# Patient Record
Sex: Male | Born: 1960 | Race: White | Hispanic: No | Marital: Married | Smoking: Never smoker
Health system: Southern US, Community
[De-identification: ages and names within clinical notes are randomized; demographics above are authoritative.]

## PROBLEM LIST (undated history)

## (undated) DIAGNOSIS — I10 Essential (primary) hypertension: Secondary | ICD-10-CM

## (undated) DIAGNOSIS — E785 Hyperlipidemia, unspecified: Secondary | ICD-10-CM

## (undated) DIAGNOSIS — Z8709 Personal history of other diseases of the respiratory system: Secondary | ICD-10-CM

## (undated) DIAGNOSIS — J302 Other seasonal allergic rhinitis: Secondary | ICD-10-CM

## (undated) HISTORY — DX: Other seasonal allergic rhinitis: J30.2

## (undated) HISTORY — DX: Personal history of other diseases of the respiratory system: Z87.09

## (undated) HISTORY — DX: Essential (primary) hypertension: I10

## (undated) HISTORY — DX: Hyperlipidemia, unspecified: E78.5

---

## 2012-02-09 ENCOUNTER — Emergency Department: Payer: Self-pay | Admitting: Emergency Medicine

## 2012-02-12 ENCOUNTER — Ambulatory Visit (INDEPENDENT_AMBULATORY_CARE_PROVIDER_SITE_OTHER): Payer: BC Managed Care – PPO | Admitting: Family Medicine

## 2012-02-12 ENCOUNTER — Encounter: Payer: Self-pay | Admitting: Family Medicine

## 2012-02-12 ENCOUNTER — Telehealth: Payer: Self-pay | Admitting: Family Medicine

## 2012-02-12 VITALS — BP 132/92 | HR 76 | Temp 98.5°F | Resp 20 | Ht 70.0 in | Wt 192.2 lb

## 2012-02-12 DIAGNOSIS — J309 Allergic rhinitis, unspecified: Secondary | ICD-10-CM

## 2012-02-12 DIAGNOSIS — Z23 Encounter for immunization: Secondary | ICD-10-CM

## 2012-02-12 DIAGNOSIS — I10 Essential (primary) hypertension: Secondary | ICD-10-CM | POA: Insufficient documentation

## 2012-02-12 DIAGNOSIS — J302 Other seasonal allergic rhinitis: Secondary | ICD-10-CM | POA: Insufficient documentation

## 2012-02-12 LAB — LIPID PANEL
Cholesterol: 186 mg/dL (ref 0–200)
HDL: 40.1 mg/dL (ref >39.00)
LDL Cholesterol: 115 mg/dL — ABNORMAL HIGH (ref 0–99)
Total CHOL/HDL Ratio: 5
Triglycerides: 157 mg/dL — ABNORMAL HIGH (ref 0.0–149.0)
VLDL: 31.4 mg/dL (ref 0.0–40.0)

## 2012-02-12 LAB — BASIC METABOLIC PANEL
GFR: 70.09 mL/min (ref >60.00)
Glucose, Bld: 109 mg/dL — ABNORMAL HIGH (ref 70–99)
Potassium: 4.3 mEq/L (ref 3.5–5.1)
Sodium: 134 mEq/L — ABNORMAL LOW (ref 135–145)

## 2012-02-12 LAB — TSH: TSH: 0.58 u[IU]/mL (ref 0.35–5.50)

## 2012-02-12 MED ORDER — LISINOPRIL-HYDROCHLOROTHIAZIDE 10-12.5 MG PO TABS: 1.0000 | ORAL_TABLET | Freq: Every day | ORAL | Status: DC

## 2012-02-12 NOTE — Progress Notes (Signed)
Subjective:    Patient ID: Keith Lawson, male    DOB: [birth year], 51 y.o.   MRN: [id number]  HPI CC: new pt to establish  Lots of stress recently.  Recently lost father from MI (5 yrs ago).  Taking care of mother who lives alone.  About to lose job.  Was taking pseudophed for 2 wks for sinus congestion prior to recent eval.    Seen at [facility] on Monday - told BP was high - 200s/115.  Treated for sinusitis with zpack.  That is doing better now.  Returned to have bp checked next day, still high at 180/110.  Sent to ER, on recheck 155/100.  Just bought bp cuff.  HTN - lisinopril/hctz was started on Monday.  No HA, vision changes, CP/tightness, SOB, leg swelling.   Occasionally feels hot - occasional night time sweats.  Started after father passed away, ?stress related.  Preventative: No recent CPE. Flu shot declines Tetanus shot - Tdap today.  Caffeine: mountain dews 1/day, 2 cups tea Lives with wife, 1 daughter (1998), 1 dog Occupation: Chartered certified accountant Edu: community [employer] Activity: walking some, occasional bike riding  Medications and allergies reviewed and updated in chart.  Past histories reviewed and updated if relevant as below. There is no problem list on file for this patient.  Past Medical History  Diagnosis Date  . Seasonal allergic rhinitis     nasal congestion  . Asthma     Hx of asthma as a child  . HTN (hypertension)    History reviewed. No pertinent past surgical history. History  Substance Use Topics  . Smoking status: Never Smoker   . Smokeless tobacco: Never Used  . Alcohol Use: No   Family History  Problem Relation Age of Onset  . CAD Father     MI  . Hypertension Father   . Hypertension Paternal Grandmother   . Stroke Neg Hx   . Cancer Neg Hx   . Diabetes Neg Hx    Allergies  Allergen Reactions  . Prednisone Itching   Current Outpatient Prescriptions on File Prior to Visit  Medication Sig Dispense Refill  . lisinopril-hydrochlorothiazide  (PRINZIDE,ZESTORETIC) 10-12.5 MG per tablet Take 1 tablet by mouth Daily.         Review of Systems  Constitutional: Negative for fever, chills, activity change, appetite change, fatigue and unexpected weight change.  HENT: Negative for hearing loss and neck pain.   Eyes: Negative for visual disturbance.  Respiratory: Negative for cough, chest tightness, shortness of breath and wheezing.   Cardiovascular: Negative for chest pain, palpitations and leg swelling.  Gastrointestinal: Negative for nausea, vomiting, abdominal pain, diarrhea, constipation, blood in stool and abdominal distention.  Genitourinary: Negative for hematuria and difficulty urinating.  Musculoskeletal: Negative for myalgias and arthralgias.  Skin: Negative for rash.  Neurological: Negative for dizziness, seizures, syncope and headaches.  Hematological: Does not bruise/bleed easily.  Psychiatric/Behavioral: Negative for dysphoric mood. The patient is not nervous/anxious.        Objective:   Physical Exam  Nursing note and vitals reviewed. Constitutional: He is oriented to person, place, and time. He appears well-developed and well-nourished. No distress.  HENT:  Head: Normocephalic and atraumatic.  Right Ear: External ear normal.  Left Ear: External ear normal.  Nose: Nose normal.  Mouth/Throat: No oropharyngeal exudate.  Eyes: Conjunctivae normal and EOM are normal. Pupils are equal, round, and reactive to light. No scleral icterus.  Neck: Normal range of motion. Neck supple. Carotid bruit is not present.  No thyromegaly present.  Cardiovascular: Normal rate, regular rhythm, normal heart sounds and intact distal pulses.   No murmur heard. Pulses:      Radial pulses are 2+ on the right side, and 2+ on the left side.  Pulmonary/Chest: Effort normal and breath sounds normal. No respiratory distress. He has no wheezes. He has no rales.  Abdominal: Soft. Bowel sounds are normal. He exhibits no distension and no mass.  There is no tenderness. There is no rebound and no guarding.  Musculoskeletal: Normal range of motion. He exhibits no edema.  Lymphadenopathy:    He has no cervical adenopathy.  Neurological: He is alert and oriented to person, place, and time.       CN grossly intact, station and gait intact  Skin: Skin is warm and dry. No rash noted.  Psychiatric: He has a normal mood and affect. His behavior is normal. Judgment and thought content normal.      Assessment & Plan:

## 2012-02-12 NOTE — Assessment & Plan Note (Addendum)
Discussed HTN as well as reasons to treat. Check baseline EKG today as well as baseline labs to r/o reversible causes. rtc 1-2 mo for CPE, and HTN recheck. Reports tolerating med well. No need currently for further eval of 2ndary causes of HTN given great response to lisinopril HCTZ.   Check Cr today after starting ACEI 5d ago.  EKG - NSR rate of 68, normal axis, intervals, no acute ST/T changes.  no old to compare.  ?borderline LVH, early repolarization, q waves lateral leads Abnormal EKG - consider repeat once better control of BP to re evaluate, if abnormalities remain, refer to cards.

## 2012-02-12 NOTE — Patient Instructions (Addendum)
Blood pressure is looking better! EKG today.  Blood work today. Tdap today (tetanus and pertussis) Return as needed or in 1-2 months for physical.  Keep a log 1 week prior to physical of blood pressures.  Goal <140/90. Good to see you today, call us with questions or if any concerns with blood pressure.

## 2012-02-12 NOTE — Telephone Encounter (Signed)
Results not back at this time. Patient notified at appt that it would be Monday before he hears anything, but that it could be today if resulted in time.

## 2012-02-12 NOTE — Telephone Encounter (Signed)
Patient had bloodwork this morning and patient's wife called to find out if the results had come back yet.  Please call patient

## 2012-02-15 ENCOUNTER — Telehealth: Payer: Self-pay

## 2012-02-15 NOTE — Telephone Encounter (Signed)
Patient aware of results.

## 2012-02-15 NOTE — Telephone Encounter (Signed)
pts wife called for lab results; did not find dpr form; was asked to call pt. Patient notified as instructed by telephone under lab results.

## 2012-03-02 ENCOUNTER — Other Ambulatory Visit: Payer: Self-pay | Admitting: Family Medicine

## 2012-03-02 DIAGNOSIS — R739 Hyperglycemia, unspecified: Secondary | ICD-10-CM

## 2012-03-09 ENCOUNTER — Other Ambulatory Visit: Payer: BC Managed Care – PPO

## 2012-03-16 ENCOUNTER — Encounter: Payer: BC Managed Care – PPO | Admitting: Family Medicine

## 2012-03-31 ENCOUNTER — Other Ambulatory Visit: Payer: BC Managed Care – PPO

## 2012-04-07 ENCOUNTER — Encounter: Payer: BC Managed Care – PPO | Admitting: Family Medicine

## 2013-01-31 ENCOUNTER — Encounter: Payer: BC Managed Care – PPO | Admitting: Family Medicine

## 2013-02-02 ENCOUNTER — Encounter: Payer: Self-pay | Admitting: Family Medicine

## 2013-02-02 ENCOUNTER — Ambulatory Visit (INDEPENDENT_AMBULATORY_CARE_PROVIDER_SITE_OTHER): Payer: BC Managed Care – PPO | Admitting: Family Medicine

## 2013-02-02 VITALS — BP 146/90 | HR 63 | Temp 98.1°F | Ht 69.5 in | Wt 179.5 lb

## 2013-02-02 DIAGNOSIS — R7309 Other abnormal glucose: Secondary | ICD-10-CM

## 2013-02-02 DIAGNOSIS — Z0000 Encounter for general adult medical examination without abnormal findings: Secondary | ICD-10-CM | POA: Insufficient documentation

## 2013-02-02 DIAGNOSIS — Z1211 Encounter for screening for malignant neoplasm of colon: Secondary | ICD-10-CM

## 2013-02-02 DIAGNOSIS — R7303 Prediabetes: Secondary | ICD-10-CM | POA: Insufficient documentation

## 2013-02-02 DIAGNOSIS — E785 Hyperlipidemia, unspecified: Secondary | ICD-10-CM | POA: Insufficient documentation

## 2013-02-02 DIAGNOSIS — R739 Hyperglycemia, unspecified: Secondary | ICD-10-CM

## 2013-02-02 DIAGNOSIS — I10 Essential (primary) hypertension: Secondary | ICD-10-CM

## 2013-02-02 NOTE — Progress Notes (Signed)
Subjective:    Patient ID: Keith Lawson, male    DOB: 04/11/1961, 52 y.o.   MRN: [id number]  HPI CC: CPE  HTN - compliant with lisinopril hctz 10/12.5mg .  Checks at home and runs 135/85. Avoids salt. Good water.  Seat belt use discussed.  80%. Sunscreen use discussed.  Does not use.  No changing moles.  Preventative:  Colon cancer screening - discussed. Would like stool kit Prostate cancer - no fmhx .  No nocturia, strong stream. Flu shot at work Tetanus shot - 02/12/2012   "[name]" Caffeine: mountain dews 1/day, 2 cups tea Lives with wife, 1 daughter (1998), 1 dog Occupation: Chartered certified accountant Edu: community college Activity: walking some, occasional bike riding Diet: good water daily, some fruits/vegetables  Medications and allergies reviewed and updated in chart.  Past histories reviewed and updated if relevant as below. Patient Active Problem List   Diagnosis Date Noted  . Seasonal allergic rhinitis   . HTN (hypertension)    Past Medical History  Diagnosis Date  . Seasonal allergic rhinitis     nasal congestion  . History of asthma     as a child  . HTN (hypertension)    No past surgical history on file. History  Substance Use Topics  . Smoking status: Never Smoker   . Smokeless tobacco: Never Used  . Alcohol Use: No   Family History  Problem Relation Age of Onset  . CAD Father 72    MI  . Hypertension Father   . Hypertension Paternal Grandmother   . Stroke Neg Hx   . Cancer Neg Hx   . Diabetes Neg Hx    Allergies  Allergen Reactions  . Prednisone Itching   Current Outpatient Prescriptions on File Prior to Visit  Medication Sig Dispense Refill  . lisinopril-hydrochlorothiazide (PRINZIDE,ZESTORETIC) 10-12.5 MG per tablet Take 1 tablet by mouth daily.  30 tablet  11   No current facility-administered medications on file prior to visit.     Review of Systems  Constitutional: Negative for fever, chills, activity change, appetite change, fatigue and  unexpected weight change.  HENT: Negative for hearing loss.   Eyes: Negative for visual disturbance.  Respiratory: Negative for cough, chest tightness, shortness of breath and wheezing.   Cardiovascular: Negative for chest pain, palpitations and leg swelling.  Gastrointestinal: Negative for nausea, vomiting, abdominal pain, diarrhea, constipation, blood in stool and abdominal distention.  Genitourinary: Negative for hematuria and difficulty urinating.  Musculoskeletal: Negative for arthralgias, myalgias and neck pain.  Skin: Negative for rash.  Neurological: Negative for dizziness, seizures, syncope and headaches.  Hematological: Negative for adenopathy. Does not bruise/bleed easily.  Psychiatric/Behavioral: Negative for dysphoric mood. The patient is not nervous/anxious.        Objective:   Physical Exam  Nursing note and vitals reviewed. Constitutional: He is oriented to person, place, and time. He appears well-developed and well-nourished. No distress.  HENT:  Head: Normocephalic and atraumatic.  Right Ear: Hearing, tympanic membrane, external ear and ear canal normal.  Left Ear: Hearing, tympanic membrane, external ear and ear canal normal.  Nose: Nose normal.  Mouth/Throat: Uvula is midline, oropharynx is clear and moist and mucous membranes are normal. No oropharyngeal exudate, posterior oropharyngeal edema, posterior oropharyngeal erythema or tonsillar abscesses.  Eyes: Conjunctivae and EOM are normal. Pupils are equal, round, and reactive to light. No scleral icterus.  Neck: Normal range of motion. Neck supple. No thyromegaly present.  Cardiovascular: Normal rate, regular rhythm, normal heart sounds and intact distal pulses.  No murmur heard. Pulses:      Radial pulses are 2+ on the right side, and 2+ on the left side.  Pulmonary/Chest: Effort normal and breath sounds normal. No respiratory distress. He has no wheezes. He has no rales.  Abdominal: Soft. Bowel sounds are  normal. He exhibits no distension and no mass. There is no tenderness. There is no rebound and no guarding.  Musculoskeletal: Normal range of motion. He exhibits no edema.  Lymphadenopathy:    He has no cervical adenopathy.  Neurological: He is alert and oriented to person, place, and time.  CN grossly intact, station and gait intact  Skin: Skin is warm and dry. No rash noted.  Psychiatric: He has a normal mood and affect. His behavior is normal. Judgment and thought content normal.      Assessment & Plan:

## 2013-02-02 NOTE — Assessment & Plan Note (Signed)
Continue lisinopril hctz combo. States at home bp runs well controlled. Reviewed dietary approaches to lower blood pressure

## 2013-02-02 NOTE — Patient Instructions (Signed)
We will check blood work today - kidneys, liver, sugar, and cholesterol levels - and contact you with results. Last year sugar was a bit high. Good to see you today, call us with questions. Return in 1 year for next physical

## 2013-02-02 NOTE — Assessment & Plan Note (Addendum)
Check A1c today.  Does like his mountain dew

## 2013-02-02 NOTE — Assessment & Plan Note (Signed)
Preventative protocols reviewed and updated unless pt declined. Discussed healthy diet and lifestyle.  Declines flu shot. 

## 2013-02-02 NOTE — Assessment & Plan Note (Signed)
Chronic, mild off meds. 

## 2013-02-03 LAB — COMPREHENSIVE METABOLIC PANEL
ALT: 27 U/L (ref 0–53)
Alkaline Phosphatase: 63 U/L (ref 39–117)
CO2: 27 mEq/L (ref 19–32)
Calcium: 10.3 mg/dL (ref 8.4–10.5)
Chloride: 100 mEq/L (ref 96–112)
GFR: 66.53 mL/min (ref >60.00)
Sodium: 137 mEq/L (ref 135–145)
Total Protein: 7.6 g/dL (ref 6.0–8.3)

## 2013-02-03 LAB — LIPID PANEL: Triglycerides: 304 mg/dL — ABNORMAL HIGH (ref 0.0–149.0)

## 2013-02-03 LAB — HEMOGLOBIN A1C: Hgb A1c MFr Bld: 6 % (ref 4.6–6.5)

## 2013-02-03 NOTE — Addendum Note (Signed)
Addended by: Alvina Chou on: 02/03/2013 08:16 AM   Modules accepted: Orders

## 2013-02-04 ENCOUNTER — Other Ambulatory Visit: Payer: Self-pay | Admitting: Family Medicine

## 2013-02-04 ENCOUNTER — Encounter: Payer: Self-pay | Admitting: Family Medicine

## 2013-02-04 MED ORDER — ATORVASTATIN CALCIUM 20 MG PO TABS: 20.0000 mg | ORAL_TABLET | Freq: Every day | ORAL | Status: DC

## 2013-02-08 ENCOUNTER — Other Ambulatory Visit: Payer: Self-pay | Admitting: Family Medicine

## 2013-02-23 ENCOUNTER — Other Ambulatory Visit: Payer: Self-pay

## 2013-04-17 ENCOUNTER — Ambulatory Visit (INDEPENDENT_AMBULATORY_CARE_PROVIDER_SITE_OTHER): Payer: BC Managed Care – PPO | Admitting: Internal Medicine

## 2013-04-17 ENCOUNTER — Encounter: Payer: Self-pay | Admitting: Internal Medicine

## 2013-04-17 VITALS — BP 130/84 | HR 73 | Temp 98.3°F | Ht 70.0 in | Wt 198.0 lb

## 2013-04-17 DIAGNOSIS — J019 Acute sinusitis, unspecified: Secondary | ICD-10-CM | POA: Insufficient documentation

## 2013-04-17 MED ORDER — AMOXICILLIN 500 MG PO TABS: 1000.0000 mg | ORAL_TABLET | Freq: Two times a day (BID) | ORAL | Status: DC

## 2013-04-17 NOTE — Progress Notes (Signed)
Pre-visit discussion using our clinic review tool. No additional management support is needed unless otherwise documented below in the visit note.  

## 2013-04-17 NOTE — Progress Notes (Signed)
   Subjective:    Patient ID: Keith Lawson, male    DOB: 1960-11-16, 52 y.o.   MRN: [id number]  HPI Started a week or more ago---bad sore throat Then started with sneezing and watery eyes Now with maxillary pain Taste is gone for 2 days  Has avoided decongestant due to BP Did try nyquil and mucinex--may have helped slightly (the nyquil) Antihistamine no help  Slight cough Some nasal drainage Right ear pain when he blows his nose---will pop Hearing is off some  Current Outpatient Prescriptions on File Prior to Visit  Medication Sig Dispense Refill  . atorvastatin (LIPITOR) 20 MG tablet Take 1 tablet (20 mg total) by mouth daily.  90 tablet  3  . lisinopril-hydrochlorothiazide (PRINZIDE,ZESTORETIC) 10-12.5 MG per tablet TAKE 1 TABLET BY MOUTH ONCE A DAY  30 tablet  6   No current facility-administered medications on file prior to visit.    Allergies  Allergen Reactions  . Prednisone Itching    Past Medical History  Diagnosis Date  . Seasonal allergic rhinitis     nasal congestion  . History of asthma     as a child  . HTN (hypertension)   . Dyslipidemia     No past surgical history on file.  Family History  Problem Relation Age of Onset  . CAD Father 62    MI  . Hypertension Father   . Hypertension Paternal Grandmother   . Stroke Neg Hx   . Cancer Neg Hx   . Diabetes Neg Hx     History   Social History  . Marital Status: Married    Spouse Name: N/A    Number of Children: N/A  . Years of Education: N/A   Occupational History  . Not on file.   Social History Main Topics  . Smoking status: Never Smoker   . Smokeless tobacco: Never Used  . Alcohol Use: No  . Drug Use: No  . Sexual Activity: Not on file   Other Topics Concern  . Not on file   Social History Narrative   "[name]"   Caffeine: mountain dews 1/day, 2 cups tea   Lives with wife, 1 daughter (1998), 1 dog   Occupation: Chartered certified accountant   Edu: community college   Activity: walking some,  occasional bike riding   Diet: good water daily, some fruits/vegetables   Review of Systems No rash No vomiting or diarrhea Appetite is okay    Objective:   Physical Exam  Constitutional: He appears well-developed and well-nourished. No distress.  HENT:  No sinus tenderness Moderate nasal inflammation TMs normal Slight pharyngeal injection  Neck: Normal range of motion. Neck supple.  Pulmonary/Chest: Effort normal and breath sounds normal. No respiratory distress. He has no wheezes. He has no rales.  Lymphadenopathy:    He has no cervical adenopathy.  Skin: No rash noted.          Assessment & Plan:

## 2013-04-17 NOTE — Patient Instructions (Signed)

## 2013-04-17 NOTE — Assessment & Plan Note (Signed)
Sick for over a week and not improving ?bacterial infection Will treat with amoxil. If not better by 1/2--would change to augmentin Discussed supportive care

## 2013-08-15 ENCOUNTER — Other Ambulatory Visit: Payer: Self-pay

## 2013-08-15 MED ORDER — LISINOPRIL-HYDROCHLOROTHIAZIDE 10-12.5 MG PO TABS: ORAL_TABLET | ORAL | Status: DC

## 2013-08-15 NOTE — Telephone Encounter (Signed)
Keith Lawson [facility] Advised done.

## 2013-09-27 ENCOUNTER — Ambulatory Visit: Payer: BC Managed Care – PPO | Admitting: Family Medicine

## 2013-10-04 ENCOUNTER — Encounter: Payer: Self-pay | Admitting: Family Medicine

## 2013-10-04 ENCOUNTER — Ambulatory Visit (INDEPENDENT_AMBULATORY_CARE_PROVIDER_SITE_OTHER): Payer: 59 | Admitting: Family Medicine

## 2013-10-04 VITALS — BP 126/80 | HR 64 | Temp 98.0°F | Wt 192.5 lb

## 2013-10-04 DIAGNOSIS — M549 Dorsalgia, unspecified: Secondary | ICD-10-CM

## 2013-10-04 MED ORDER — HYDROCODONE-ACETAMINOPHEN 5-325 MG PO TABS: 1.0000 | ORAL_TABLET | Freq: Four times a day (QID) | ORAL | Status: DC | PRN

## 2013-10-04 NOTE — Progress Notes (Signed)
Pre visit review using our clinic review tool, if applicable. No additional management support is needed unless otherwise documented below in the visit note.  Tractor pull 2 weeks ago, was lifting a lot at the event.  Did okay at the event.  The next day he had R leg and back pain, radicular pain.  No L sided pain.  Better sitting down, until he gets up.  No specific injury.  Taking ibuprofen and doan's prev.  Can't sleep from pain.  No FCNAVD.  No B/B sx.  No weakness but pain with walking.    Meds, vitals, and allergies reviewed.   ROS: See HPI.  Otherwise, noncontributory.  nad ncat rrr ctab Back w/o midline pain but R lower back ttp in paraspinal muscles.  R leg, L3 distribution for pain with SLR Distally NV intact with normal strength and DTRs

## 2013-10-04 NOTE — Patient Instructions (Signed)
Use the stretches and take up to 3 advil at a time (600mg  per dose), up to 3 times a day with food.  Use the hydrocodone if needed (sedation caution). Take care.  If not better, then let us now.   Glad to see you.

## 2013-10-05 ENCOUNTER — Ambulatory Visit: Payer: BC Managed Care – PPO | Admitting: Family Medicine

## 2013-10-05 DIAGNOSIS — M549 Dorsalgia, unspecified: Secondary | ICD-10-CM | POA: Insufficient documentation

## 2013-10-05 NOTE — Assessment & Plan Note (Signed)
With radicular component, inc NSAIDS with GI caution. Use hydrocodone prn in meantime, sedation caution.  Given handout on stretching and back exercises.  Anatomy d/w pt.

## 2013-10-10 ENCOUNTER — Telehealth: Payer: Self-pay

## 2013-10-10 NOTE — Telephone Encounter (Signed)
Pt's wife said he will try taking 2 tabs tonight and see if that helps and if it doesn't she will call back tomorrow and schedule a f/u appt with Dr. Para Marchuncan

## 2013-10-10 NOTE — Telephone Encounter (Signed)
We can't call in anything stronger.  He can take 2 at a time.  If not any better with that, then we need to get him back in the office to recheck him and potentially image him.  Thanks.

## 2013-10-10 NOTE — Telephone Encounter (Signed)
pts wife left v/m; pt seen06/17/15 with back pain that goes down leg; pt doing exercises and taking hydrocodone at night time; hydrocodone not touching the pain; request stronger pain med.Please advise.Mrs Keith OresRichardson request cb.

## 2013-10-13 ENCOUNTER — Other Ambulatory Visit: Payer: Self-pay

## 2013-10-13 MED ORDER — HYDROCODONE-ACETAMINOPHEN 5-325 MG PO TABS: 1.0000 | ORAL_TABLET | Freq: Four times a day (QID) | ORAL | Status: DC | PRN

## 2013-10-13 NOTE — Telephone Encounter (Signed)
Mrs Keith Lawson left v/m pt has been taking 2 tabs of hydrocodone apap and that does seem to help pain; request new rx with updated instructions of taking 2 tabs instead of 1 tab. Mrs Keith Lawson request cb when ready for pick up. (I have not changed instructions from 1 to 2 until approved by Dr Para Marchuncan.

## 2013-10-13 NOTE — Telephone Encounter (Signed)
See prev phone note.  Okay to fill.  Printed. Thanks.

## 2013-10-13 NOTE — Telephone Encounter (Signed)
RX given to pt's wife.

## 2013-10-19 ENCOUNTER — Ambulatory Visit (INDEPENDENT_AMBULATORY_CARE_PROVIDER_SITE_OTHER): Payer: 59 | Admitting: Family Medicine

## 2013-10-19 ENCOUNTER — Encounter: Payer: Self-pay | Admitting: Family Medicine

## 2013-10-19 ENCOUNTER — Telehealth: Payer: Self-pay

## 2013-10-19 ENCOUNTER — Ambulatory Visit (INDEPENDENT_AMBULATORY_CARE_PROVIDER_SITE_OTHER)
Admission: RE | Admit: 2013-10-19 | Discharge: 2013-10-19 | Disposition: A | Discharge status: Home / Self Care | Payer: 59 | Source: Ambulatory Visit | Attending: Family Medicine | Admitting: Family Medicine

## 2013-10-19 VITALS — BP 128/80 | HR 72 | Temp 97.9°F | Wt 192.5 lb

## 2013-10-19 DIAGNOSIS — M5441 Lumbago with sciatica, right side: Secondary | ICD-10-CM

## 2013-10-19 DIAGNOSIS — M5430 Sciatica, unspecified side: Secondary | ICD-10-CM

## 2013-10-19 MED ORDER — GABAPENTIN 300 MG PO CAPS: 300.0000 mg | ORAL_CAPSULE | Freq: Three times a day (TID) | ORAL | Status: DC

## 2013-10-19 NOTE — Progress Notes (Signed)
Pre visit review using our clinic review tool, if applicable. No additional management support is needed unless otherwise documented below in the visit note.  Still with back pain.  Prev note reviewed:  Tractor pull ~4 weeks ago, was lifting a lot at the event. Did okay at the event. The next day he had R leg and back pain, radicular pain. No L sided pain. Better sitting down, until he gets up. No specific injury. Taking hydrocodone and ibuprofen didn't help much at all. He stopped the hydrocodone in the meantime. No pain pain below the R knee.  No weakness.  Normal sensation. No B/B sx.  Meds, vitals, and allergies reviewed.   ROS: See HPI.  Otherwise, noncontributory.  nad rrr ctab SLR neg today.  Midline back not ttp Still with some tenderness in R lower back but improved from prev.   No weakness in the BLE S/S grossly wnl BLE He is able to stand with less pain right now compared to the last OV.

## 2013-10-19 NOTE — Patient Instructions (Signed)
Take 2-3 ibuprofen pills up to 3 times a day with food.  Start with 1 gabapentin a day for 2 days, then 1 twice a day if needed for 2 days, then 1 three times a day.  It can make you drowsy and dizzy. Update me Monday.  Keep using the back exercises.  Go to the lab on the way out.  We'll contact you with your xray report. Take care.

## 2013-10-19 NOTE — Assessment & Plan Note (Signed)
Some better but not resolved.  Would check L spine films today and start gabapentin, with routine cautions re: sedation and dizziness.  He agrees.  He'll update me.  If not better, consider MRI L spine.  No weakness or emergent sx.

## 2013-10-19 NOTE — Telephone Encounter (Signed)
Ms Keith Lawson left v/m requesting cb on xray results done today.

## 2013-10-19 NOTE — Telephone Encounter (Signed)
Wife advised. 

## 2013-10-19 NOTE — Telephone Encounter (Signed)
Notify pt. No fracture or acute finding. Degenerative changes noted.   Continue as planned with the gabapentin and then update us next week.  Thanks.

## 2013-10-24 NOTE — Telephone Encounter (Signed)
Wife advised.  She says she will talk to him to see if he will agree to the MRI.  Wife says the patient thinks it is getting some better but not as fast as he would like.  Wife will call back with the patient's decision.

## 2013-10-24 NOTE — Telephone Encounter (Signed)
Noted. Thanks.

## 2013-10-24 NOTE — Telephone Encounter (Addendum)
Robin left v/m; pt is not feeling much better, pt has discussed with Dr Para Marchuncan about taking prednisone and pt would like to [facility]one[facility]Please advise.

## 2013-10-24 NOTE — Telephone Encounter (Addendum)
I wouldn't do the pred (h/o intolerance).  The issue was getting the MR of his back if not better.  If we're at that point, then I can order it.  Thanks.

## 2014-01-26 ENCOUNTER — Telehealth: Payer: Self-pay | Admitting: Family Medicine

## 2014-01-26 NOTE — Telephone Encounter (Signed)
Forwarded to Dr. Para Marchuncan as he saw patient for this. In your IN box for completion.

## 2014-01-26 NOTE — Telephone Encounter (Signed)
Pt's wife dropped off form to be filled out by Dr Sharen HonesGutierrez. Its pertaining to the pt's recent back problems. You may call the pts wife when form is completed. Form placed on Kim's desk to then give to Dr Sharen HonesGutierrez to fill out. Thank you

## 2014-01-28 NOTE — Telephone Encounter (Signed)
Form completed as needed.  All other info needed is in the notes from 10/04/13 and 10/19/13. Please send those OV notes along with the xray report from 10/19/13, along with the form. Thanks.

## 2014-01-30 NOTE — Telephone Encounter (Signed)
Spoke with patient's wife to notify her paperwork has been completed. She stated that she will come pick it up today.

## 2014-04-17 ENCOUNTER — Other Ambulatory Visit: Payer: Self-pay | Admitting: Family Medicine

## 2014-05-17 ENCOUNTER — Other Ambulatory Visit: Payer: Self-pay | Admitting: Family Medicine

## 2014-06-05 ENCOUNTER — Other Ambulatory Visit: Payer: Self-pay | Admitting: *Deleted

## 2014-06-05 MED ORDER — LISINOPRIL-HYDROCHLOROTHIAZIDE 10-12.5 MG PO TABS: ORAL_TABLET | ORAL | Status: DC

## 2014-06-05 NOTE — Telephone Encounter (Signed)
I spoke with wife and advised her that I would send in refills for him, but he is overdue for a physical and will need to call asap to have that scheduled. She is aware no further refills until cpx.

## 2014-07-05 ENCOUNTER — Ambulatory Visit (INDEPENDENT_AMBULATORY_CARE_PROVIDER_SITE_OTHER): Payer: 59 | Admitting: Family Medicine

## 2014-07-05 ENCOUNTER — Encounter: Payer: Self-pay | Admitting: Family Medicine

## 2014-07-05 VITALS — BP 104/70 | HR 63 | Temp 98.6°F | Ht 70.0 in | Wt 192.0 lb

## 2014-07-05 DIAGNOSIS — R7303 Prediabetes: Secondary | ICD-10-CM

## 2014-07-05 DIAGNOSIS — Z1211 Encounter for screening for malignant neoplasm of colon: Secondary | ICD-10-CM

## 2014-07-05 DIAGNOSIS — I10 Essential (primary) hypertension: Secondary | ICD-10-CM

## 2014-07-05 DIAGNOSIS — Z0000 Encounter for general adult medical examination without abnormal findings: Secondary | ICD-10-CM

## 2014-07-05 DIAGNOSIS — E785 Hyperlipidemia, unspecified: Secondary | ICD-10-CM

## 2014-07-05 DIAGNOSIS — R7309 Other abnormal glucose: Secondary | ICD-10-CM

## 2014-07-05 MED ORDER — LISINOPRIL 10 MG PO TABS: 10.0000 mg | ORAL_TABLET | Freq: Every day | ORAL | Status: DC

## 2014-07-05 NOTE — Assessment & Plan Note (Signed)
Check A1c today.

## 2014-07-05 NOTE — Assessment & Plan Note (Addendum)
Chronic, reviewed last elevated FLP  Discussed dietary changes to affect trig lowering. Recheck today.

## 2014-07-05 NOTE — Addendum Note (Signed)
Addended by: Georgi Navarrete on: 07/05/2014 06:45 PM   Modules accepted: SmartSet  

## 2014-07-05 NOTE — Progress Notes (Signed)
BP 104/70 mmHg  Pulse 63  Temp(Src) 98.6 F (37 C) (Oral)  Ht _0  (1.778 m)  Wt 192 lb (87.091 kg)  BMI 27.55 kg/m2  SpO2 97%   CC: CPE  Subjective:    Patient ID: Keith Chiquito., male    DOB: 1960-06-11, 54 y.o.   MRN: [id number]  HPI: Keith Engelmann. is a 54 y.o. male presenting on 07/05/2014 for Annual Exam   HTN - doesn't regularly check bp.  BP Readings from Last 3 Encounters:  07/05/14 104/70  10/19/13 128/80  10/04/13 126/80    Last meal was hot dog at 2pm. Drank sweet tea.  Preventative: Colon cancer screening - discussed. Would like stool kit Prostate cancer screening - no fmhx . No nocturia, strong stream. Defers today. Flu shot declines  Tetanus shot - 02/12/2012  Seat belt use discussed.  Sunscreen use discussed. No changing moles.   "Keith Lawson" Caffeine: mountain dews 1/day, 2 cups tea Lives with wife, 1 daughter (1998), 1 dog Occupation: Furniture conservator/restorer Edu: community [employer] Activity: walking some, occasional bike riding Diet: good water daily, some fruits/vegetables  Relevant past medical, surgical, family and social history reviewed and updated as indicated. Interim medical history since our last visit reviewed. Allergies and medications reviewed and updated. Current Outpatient Prescriptions on File Prior to Visit  Medication Sig  . atorvastatin (LIPITOR) 20 MG tablet Take 1 tablet (20 mg total) by mouth daily.  Marland Kitchen ibuprofen (ADVIL,MOTRIN) 200 MG tablet Take 200 mg by mouth every 6 (six) hours as needed.  . vitamin C (ASCORBIC ACID) 500 MG tablet Take 500 mg by mouth daily.   No current facility-administered medications on file prior to visit.    Review of Systems  Constitutional: Negative for fever, chills, activity change, appetite change, fatigue and unexpected weight change.  HENT: Negative for hearing loss.   Eyes: Negative for visual disturbance.  Respiratory: Negative for cough, chest tightness, shortness of breath and wheezing.    Cardiovascular: Negative for chest pain, palpitations and leg swelling.  Gastrointestinal: Negative for nausea, vomiting, abdominal pain, diarrhea, constipation, blood in stool and abdominal distention.  Genitourinary: Negative for hematuria and difficulty urinating.  Musculoskeletal: Negative for myalgias, arthralgias and neck pain.  Skin: Negative for rash.  Neurological: Negative for dizziness, seizures, syncope and headaches.  Hematological: Negative for adenopathy. Does not bruise/bleed easily.  Psychiatric/Behavioral: Negative for dysphoric mood. The patient is not nervous/anxious.    Per HPI unless specifically indicated above     Objective:    BP 104/70 mmHg  Pulse 63  Temp(Src) 98.6 F (37 C) (Oral)  Ht _1  (1.778 m)  Wt 192 lb (87.091 kg)  BMI 27.55 kg/m2  SpO2 97%  Wt Readings from Last 3 Encounters:  07/05/14 192 lb (87.091 kg)  10/19/13 192 lb 8 oz (87.317 kg)  10/04/13 192 lb 8 oz (87.317 kg)    Physical Exam  Constitutional: He is oriented to person, place, and time. He appears well-developed and well-nourished. No distress.  HENT:  Head: Normocephalic and atraumatic.  Right Ear: Hearing, tympanic membrane, external ear and ear canal normal.  Left Ear: Hearing, tympanic membrane, external ear and ear canal normal.  Nose: Nose normal.  Mouth/Throat: Uvula is midline, oropharynx is clear and moist and mucous membranes are normal. No oropharyngeal exudate, posterior oropharyngeal edema or posterior oropharyngeal erythema.  Eyes: Conjunctivae and EOM are normal. Pupils are equal, round, and reactive to light. No scleral icterus.  Neck: Normal range of  motion. Neck supple. No thyromegaly present.  Cardiovascular: Normal rate, regular rhythm, normal heart sounds and intact distal pulses.   No murmur heard. Pulses:      Radial pulses are 2+ on the right side, and 2+ on the left side.  Pulmonary/Chest: Effort normal and breath sounds normal. No respiratory  distress. He has no wheezes. He has no rales.  Abdominal: Soft. Bowel sounds are normal. He exhibits no distension and no mass. There is no tenderness. There is no rebound and no guarding.  Musculoskeletal: Normal range of motion. He exhibits no edema.  Lymphadenopathy:    He has no cervical adenopathy.  Neurological: He is alert and oriented to person, place, and time.  CN grossly intact, station and gait intact  Skin: Skin is warm and dry. No rash noted.  Psychiatric: He has a normal mood and affect. His behavior is normal. Judgment and thought content normal.  Nursing note and vitals reviewed.      Assessment & Plan:   Problem List Items Addressed This Visit    Prediabetes    Check A1c today.      Relevant Orders   Hemoglobin A1c   HTN (hypertension)    Chronic, stable. Actually bp low today - will stop hctz and start plain lisinopril 32m daily.       Relevant Medications   lisinopril (PRINIVIL,ZESTRIL) tablet   Other Relevant Orders   Basic metabolic panel   HLD (hyperlipidemia)    Chronic, reviewed last elevated FLP  Discussed dietary changes to affect trig lowering. Recheck today.      Relevant Medications   lisinopril (PRINIVIL,ZESTRIL) tablet   Other Relevant Orders   Lipid panel   Healthcare maintenance - Primary    Preventative protocols reviewed and updated unless pt declined. Discussed healthy diet and lifestyle.        Other Visit Diagnoses    Special screening for malignant neoplasms, colon        Relevant Orders    Fecal occult blood, imunochemical        Follow up plan: Return in about 1 year (around 07/05/2015), or as needed, for annual exam, prior fasting for blood work.

## 2014-07-05 NOTE — Patient Instructions (Addendum)
Try new plain lisinopril 10mg daily for blood pressure control. Monitor bp at home to ensure staying well controlled <140/90. Pass by lab to pick up stool kit. Labwork today. Return as needed or in 1 year for next physical. 

## 2014-07-05 NOTE — Progress Notes (Signed)
Pre visit review using our clinic review tool, if applicable. No additional management support is needed unless otherwise documented below in the visit note. 

## 2014-07-05 NOTE — Assessment & Plan Note (Signed)
Chronic, stable. Actually bp low today - will stop hctz and start plain lisinopril 10mg  daily.

## 2014-07-05 NOTE — Assessment & Plan Note (Signed)
Preventative protocols reviewed and updated unless pt declined. Discussed healthy diet and lifestyle.  

## 2014-07-06 ENCOUNTER — Other Ambulatory Visit: Payer: Self-pay

## 2014-07-06 LAB — LIPID PANEL
Cholesterol: 193 mg/dL (ref 0–200)
HDL: 39 mg/dL — AB (ref >39.00)
NonHDL: 154
TRIGLYCERIDES: 343 mg/dL — AB (ref 0.0–149.0)
Total CHOL/HDL Ratio: 5
VLDL: 68.6 mg/dL — AB (ref 0.0–40.0)

## 2014-07-06 LAB — BASIC METABOLIC PANEL
BUN: 17 mg/dL (ref 6–23)
CO2: 28 meq/L (ref 19–32)
Calcium: 9.5 mg/dL (ref 8.4–10.5)
Chloride: 104 mEq/L (ref 96–112)
Creatinine, Ser: 1.21 mg/dL (ref 0.40–1.50)
GFR: 66.79 mL/min (ref >60.00)
Glucose, Bld: 76 mg/dL (ref 70–99)
POTASSIUM: 4.2 meq/L (ref 3.5–5.1)
SODIUM: 137 meq/L (ref 135–145)

## 2014-07-06 LAB — LDL CHOLESTEROL, DIRECT: Direct LDL: 117 mg/dL

## 2014-07-06 LAB — HEMOGLOBIN A1C: Hgb A1c MFr Bld: 5.9 % (ref 4.6–6.5)

## 2014-07-06 MED ORDER — HYDROCODONE-ACETAMINOPHEN 5-325 MG PO TABS: 1.0000 | ORAL_TABLET | Freq: Four times a day (QID) | ORAL | Status: DC | PRN

## 2014-07-06 NOTE — Telephone Encounter (Signed)
Keith Lawson request rx hydrocodone apap for on and off lower back pain. Call when ready for pick up. Pt was seen 07/05/14. Pt is out of med and request to be picked up today.

## 2014-07-06 NOTE — Telephone Encounter (Signed)
Printed and in Kim's box 

## 2014-07-06 NOTE — Telephone Encounter (Signed)
Patient's wife notified and Rx placed up front for pick up. 

## 2014-07-12 ENCOUNTER — Telehealth: Payer: Self-pay

## 2014-07-12 NOTE — Telephone Encounter (Signed)
Patients wife notified

## 2014-07-12 NOTE — Telephone Encounter (Signed)
Keith Lawson pts wife left /vm; pt seen 07/05/14 and BP med was changed; pt took BP today; BP was 130/90; since BP has gone back up pt wants to know if should change back to previous BP med. Keith Lawson request cb.

## 2014-07-12 NOTE — Telephone Encounter (Signed)
bp still overall ok - would have him check bp as able over next 1-2 weeks and keep log, then call us with #s to decide.

## 2014-07-23 ENCOUNTER — Telehealth: Payer: Self-pay | Admitting: *Deleted

## 2014-07-23 MED ORDER — CLOTRIMAZOLE 1 % EX CREA: 1.0000 "application " | TOPICAL_CREAM | Freq: Two times a day (BID) | CUTANEOUS | Status: DC

## 2014-07-23 NOTE — Telephone Encounter (Signed)
Patient's wife called stating that patient is having a lot of rectal itching with a little redness and this has been off and on for several weeks. Patient's wife stated that patient has tried several over the counter creams which helps some, but the itching got worse over the weekend. Patient's wife stated that she went to the pharmacy over the weekend checking on some cream for ring worms and was told that they no longer have OTC medication for that which she is not sure that is what it is. Patient's wife stated that patient does not want to come in for this and wants to know what Dr. Sharen HonesGutierrez would recommend or would he call in a prescriptions for this? Pharmacy [facility]

## 2014-07-23 NOTE — Telephone Encounter (Signed)
Would recommend lotrimin cream - this should be over the counter, but I've sent in just in case. If not improving with this recommend come in for eval.

## 2014-07-23 NOTE — Telephone Encounter (Signed)
Patient's wife notified and verbalized understanding.  

## 2014-07-30 ENCOUNTER — Other Ambulatory Visit: Payer: Self-pay | Admitting: Family Medicine

## 2015-06-12 ENCOUNTER — Other Ambulatory Visit: Payer: Self-pay | Admitting: Family Medicine

## 2015-09-17 ENCOUNTER — Other Ambulatory Visit: Payer: Self-pay | Admitting: Family Medicine

## 2015-09-18 ENCOUNTER — Other Ambulatory Visit: Payer: Self-pay

## 2015-09-18 MED ORDER — LISINOPRIL 10 MG PO TABS: 10.0000 mg | ORAL_TABLET | Freq: Every day | ORAL | Status: DC

## 2015-09-18 NOTE — Telephone Encounter (Signed)
Robin (DPR signed) [facility]. Before med runs out pt will call for cpx. Last annual 07/05/14. Per protocol refill done. Robin voiced understanding.

## 2015-09-26 ENCOUNTER — Ambulatory Visit: Payer: 59 | Admitting: Family Medicine

## 2015-10-17 ENCOUNTER — Ambulatory Visit: Payer: 59 | Admitting: Family Medicine

## 2015-10-24 ENCOUNTER — Encounter: Payer: Self-pay | Admitting: Family Medicine

## 2015-10-24 ENCOUNTER — Ambulatory Visit (INDEPENDENT_AMBULATORY_CARE_PROVIDER_SITE_OTHER): Payer: 59 | Admitting: Family Medicine

## 2015-10-24 VITALS — BP 156/100 | HR 72 | Temp 98.6°F | Wt 190.0 lb

## 2015-10-24 DIAGNOSIS — Z1211 Encounter for screening for malignant neoplasm of colon: Secondary | ICD-10-CM

## 2015-10-24 DIAGNOSIS — E785 Hyperlipidemia, unspecified: Secondary | ICD-10-CM | POA: Diagnosis not present

## 2015-10-24 DIAGNOSIS — I10 Essential (primary) hypertension: Secondary | ICD-10-CM | POA: Diagnosis not present

## 2015-10-24 MED ORDER — LISINOPRIL 20 MG PO TABS: 20.0000 mg | ORAL_TABLET | Freq: Every day | ORAL | Status: DC

## 2015-10-24 NOTE — Progress Notes (Signed)
Pre visit review using our clinic review tool, if applicable. No additional management support is needed unless otherwise documented below in the visit note. 

## 2015-10-24 NOTE — Patient Instructions (Addendum)
Return fasting (4-5 hours) at your convenience for fasting labs. May make 7:30am appointment or come in one afternoon/evening (we're open late Wednesday and Thursdays).  Increase lisinopril to 51m daily. New dose at pharmacy. Keep an eye on blood pressures at home. Avoid salt, get plenty of water, fruits/vegetables. Goal blood pressure <140/90. Let me know name and dose of natural supplement you're taking for cholesterol Pass by lab to pick up stool kit. Return in 1 month for follow up visit.

## 2015-10-24 NOTE — Assessment & Plan Note (Signed)
Chronic, off lipitor - caused fatigue. Taking natural supplement - will let me know of name and dose. Check FLP at f/u labs in 2 wks.

## 2015-10-24 NOTE — Assessment & Plan Note (Addendum)
Chronic, deteriorated. Increase lisinopril to 20mg  daily, return in 2 wks for fasting labs and 1 mo for f/u visit.  Recommended low salt diet.

## 2015-10-24 NOTE — Progress Notes (Signed)
BP 156/100 mmHg  Pulse 72  Temp(Src) 98.6 F (37 C) (Oral)  Wt 190 lb (86.183 kg)   CC: discuss bp meds.  Subjective:    Patient ID: Keith Lawson., male    DOB: April 14, 1961, 55 y.o.   MRN: [id number]  HPI: Anil Havard. is a 55 y.o. male presenting on 10/24/2015 for Medication Management   See prior note for details. Prior on lisinopril hctz, due to low blood pressure, hctz stopped and we started plain lisinopril 67m daily. He has been monitoring intermittently - 130s/80s.  No low bp symptoms of dizziness or syncope.  No HA, vision changes, CP/tightness, SOB, leg swelling.   HLD - not taking lipitor 217m- may have caused malaise. Has been taking natural supplement.    Colon cancer screening - discussed. Would like stool kit.   Relevant past medical, surgical, family and social history reviewed and updated as indicated. Interim medical history since our last visit reviewed. Allergies and medications reviewed and updated. Current Outpatient Prescriptions on File Prior to Visit  Medication Sig  . clotrimazole (LOTRIMIN AF) 1 % cream Apply 1 application topically 2 (two) times daily.  . Marland Kitchenbuprofen (ADVIL,MOTRIN) 200 MG tablet Take 200 mg by mouth every 6 (six) hours as needed.  . vitamin C (ASCORBIC ACID) 500 MG tablet Take 500 mg by mouth daily.   No current facility-administered medications on file prior to visit.    Review of Systems Per HPI unless specifically indicated in ROS section     Objective:    BP 156/100 mmHg  Pulse 72  Temp(Src) 98.6 F (37 C) (Oral)  Wt 190 lb (86.183 kg)  Wt Readings from Last 3 Encounters:  10/24/15 190 lb (86.183 kg)  07/05/14 192 lb (87.091 kg)  10/19/13 192 lb 8 oz (87.317 kg)    Physical Exam  Constitutional: He appears well-developed and well-nourished. No distress.  HENT:  Mouth/Throat: Oropharynx is clear and moist. No oropharyngeal exudate.  Eyes: Conjunctivae and EOM are normal. Pupils are equal, round, and  reactive to light.  Cardiovascular: Normal rate, regular rhythm, normal heart sounds and intact distal pulses.   No murmur heard. Pulmonary/Chest: Effort normal and breath sounds normal. No respiratory distress. He has no wheezes. He has no rales.  Musculoskeletal: He exhibits no edema.  Psychiatric: He has a normal mood and affect.  Nursing note and vitals reviewed.  Results for orders placed or performed in visit on 07/05/14  Hemoglobin A1c  Result Value Ref Range   Hgb A1c MFr Bld 5.9 4.6 - 6.5 %  Lipid panel  Result Value Ref Range   Cholesterol 193 0 - 200 mg/dL   Triglycerides 343.0 (H) 0.0 - 149.0 mg/dL   HDL 39.00 (L) >39.00 mg/dL   VLDL 68.6 (H) 0.0 - 40.0 mg/dL   Total CHOL/HDL Ratio 5    NonHDL 15286.38 Basic metabolic panel  Result Value Ref Range   Sodium 137 135 - 145 mEq/L   Potassium 4.2 3.5 - 5.1 mEq/L   Chloride 104 96 - 112 mEq/L   CO2 28 19 - 32 mEq/L   Glucose, Bld 76 70 - 99 mg/dL   BUN 17 6 - 23 mg/dL   Creatinine, Ser 1.21 0.40 - 1.50 mg/dL   Calcium 9.5 8.4 - 10.5 mg/dL   GFR 66.79 >60.00 mL/min  LDL cholesterol, direct  Result Value Ref Range   Direct LDL 117.0 mg/dL      Assessment & Plan:  Problem List Items Addressed This Visit    HTN (hypertension) - Primary    Chronic, deteriorated. Increase lisinopril to 8m daily, return in 2 wks for fasting labs and 1 mo for f/u visit.  Recommended low salt diet.      Relevant Medications   lisinopril (PRINIVIL,ZESTRIL) 20 MG tablet   Other Relevant Orders   Basic metabolic panel   Microalbumin / creatinine urine ratio   HLD (hyperlipidemia)    Chronic, off lipitor - caused fatigue. Taking natural supplement - will let me know of name and dose. Check FLP at f/u labs in 2 wks.       Relevant Medications   lisinopril (PRINIVIL,ZESTRIL) 20 MG tablet   Other Relevant Orders   Lipid panel    Other Visit Diagnoses    Special screening for malignant neoplasms, colon        Relevant Orders     Fecal occult blood, imunochemical        Follow up plan: Return in about 4 weeks (around 11/21/2015), or as needed, for follow up visit.  JRia Bush MD

## 2015-11-05 ENCOUNTER — Encounter: Payer: Self-pay | Admitting: Family Medicine

## 2015-11-05 ENCOUNTER — Ambulatory Visit (INDEPENDENT_AMBULATORY_CARE_PROVIDER_SITE_OTHER): Payer: 59 | Admitting: Family Medicine

## 2015-11-05 ENCOUNTER — Telehealth: Payer: Self-pay | Admitting: Family Medicine

## 2015-11-05 VITALS — BP 172/96 | HR 72 | Temp 98.5°F | Wt 187.0 lb

## 2015-11-05 DIAGNOSIS — T63441A Toxic effect of venom of bees, accidental (unintentional), initial encounter: Secondary | ICD-10-CM | POA: Insufficient documentation

## 2015-11-05 DIAGNOSIS — R1900 Intra-abdominal and pelvic swelling, mass and lump, unspecified site: Secondary | ICD-10-CM | POA: Diagnosis not present

## 2015-11-05 NOTE — Telephone Encounter (Signed)
Pt has appt 11/05/15 at 4 pm with Dr Adriana Simasook.

## 2015-11-05 NOTE — Progress Notes (Signed)
   Subjective:  Patient ID: Keith KaufmannJoel T Riva Jr., male    DOB: 05/09/1960  Age: 55 y.o. MRN: [id number]  CC: Bee sting  HPI:  55 year old male presents with the above complaint.  Patient states he was stung by a yellow jacket on his abdomen on Saturday. He states that he noticed a bulge in his abdomen earlier today. He is concerned that this was related to the bee sting. He denies any associated redness. No drainage from the area. No fevers or chills. No known exacerbating factors. Patient does note that the bulge improves when he lies down. No other complaints at this time.  Social Hx   Social History   Social History  . Marital Status: Married    Spouse Name: N/A  . Number of Children: N/A  . Years of Education: N/A   Social History Main Topics  . Smoking status: Never Smoker   . Smokeless tobacco: Never Used  . Alcohol Use: No  . Drug Use: No  . Sexual Activity: Not Asked   Other Topics Concern  . None   Social History Narrative   "[name]"   Caffeine: mountain dews 1/day, 2 cups tea   Lives with wife, 1 daughter (1998), 1 dog   Occupation: Chartered certified accountantmachinist   Edu: community college   Activity: walking some, occasional bike riding   Diet: good water daily, some fruits/vegetables   Review of Systems  Constitutional: Negative.   Gastrointestinal: Negative for abdominal pain.  Skin:       Bee sting.   Objective:  BP 172/96 mmHg  Pulse 72  Temp(Src) 98.5 F (36.9 C) (Oral)  Wt 187 lb (84.823 kg)  SpO2 95%  BP/Weight 11/05/2015 10/24/2015 07/05/2014  Systolic BP 172 156 104  Diastolic BP 96 100 70  Wt. (Lbs) 187 190 192  BMI 26.83 27.26 27.55   Physical Exam  Constitutional: He is oriented to person, place, and time. He appears well-developed. No distress.  Pulmonary/Chest: Effort normal.  Abdominal: Soft.  Left lateral abdomen with a noticeable bulge. Could not palpate an abdominal wall defect. Area of bee sting without surrounding erythema. No fluctuance, drainage or  induration.  Neurological: He is alert and oriented to person, place, and time.  Psychiatric: He has a normal mood and affect.  Vitals reviewed.  Lab Results  Component Value Date   GLUCOSE 76 07/05/2014   CHOL 193 07/05/2014   TRIG 343.0* 07/05/2014   HDL 39.00* 07/05/2014   LDLDIRECT 117.0 07/05/2014   LDLCALC 115* 02/12/2012   ALT 27 02/02/2013   AST 31 02/02/2013   NA 137 07/05/2014   K 4.2 07/05/2014   CL 104 07/05/2014   CREATININE 1.21 07/05/2014   BUN 17 07/05/2014   CO2 28 07/05/2014   TSH 0.58 02/12/2012   HGBA1C 5.9 07/05/2014   Assessment & Plan:   Problem List Items Addressed This Visit    Bee sting - Primary    New problem. Looks well. Not concerning. Supportive care.      Abdominal wall bulge    New problem. 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 Offered CT versus watchful waiting. Patient elected to watch this area closely. Follow up closely with PCP.        Follow-up: PRN  Everlene OtherJayce Monda Chastain DO [facility] [place]

## 2015-11-05 NOTE — Patient Instructions (Signed)
Keep an eye on it.  I will let Dr. Reece AgarG know  Bee sting looks fine.  Take care  Dr. Adriana Simasook

## 2015-11-05 NOTE — Assessment & Plan Note (Signed)
New problem. Looks well. Not concerning. Supportive care.

## 2015-11-05 NOTE — Progress Notes (Signed)
Pre visit review using our clinic review tool, if applicable. No additional management support is needed unless otherwise documented below in the visit note. 

## 2015-11-05 NOTE — Assessment & Plan Note (Signed)
New problem. 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 Offered CT versus watchful waiting. Patient elected to watch this area closely. Follow up closely with PCP.

## 2015-11-05 NOTE — Telephone Encounter (Signed)
TELEPHONE ADVICE RECORD [facility]  Patient Name: Keith Lawson  DOB: 06/16/1960    Initial Comment Husband got stung by a bee in the abd and now having stomach pain    Nurse Assessment  Nurse: Odis LusterBowers, RN, Bjorn Loserhonda Date/Time (Eastern Time): 11/05/2015 11:48:43 AM  Confirm and document reason for call. If symptomatic, describe symptoms. You must click the next button to save text entered. ---Husband got stung by a bee on Saturday while he was mowing in the abd and now having stomach pain. Reports that he is having pain around the waist line. The bee sting area is itchy. No ring around the sting. Reports that abd pain is on the left side and below belly button. Denies fever. Denies burning with urination, last BM was this morning. Rates pain 3-4/10 and described as a dull pain, intermittent and radiates into his side. Caller thinks this might be related to the bee sting. He has swelling from left side of the belly button to his side, near the bee sting. 2 ' wide.  Has the patient traveled out of the country within the last 30 days? ---No  Does the patient have any new or worsening symptoms? ---Yes  Will a triage be completed? ---Yes  Related visit to physician within the last 2 weeks? ---Yes  Does the PT have any chronic conditions? (i.e. diabetes, asthma, etc.) ---Yes  List chronic conditions. ---HTN,  Is the patient pregnant or possibly pregnant? (Ask all females between the ages of 7112-55) ---No  Is this a behavioral health or substance abuse call? ---No     Guidelines    Guideline Title Affirmed Question Affirmed Notes  Abdominal Pain - Male [1] MILD-MODERATE pain AND [2] constant AND [3] present > 2 hours    Final Disposition User   See Physician within 4 Hours (or PCP triage) Odis LusterBowers, RN, Rhonda    Comments  Caller reports that he has a scheduled [facility]lene [facility]ce [facility]. Advised to keep this appt and call back if symptoms  worsen before being seen.   Referrals  REFERRED TO PCP OFFICE   Disagree/Comply: Comply

## 2015-11-06 ENCOUNTER — Other Ambulatory Visit: Payer: 59

## 2015-11-13 ENCOUNTER — Other Ambulatory Visit (INDEPENDENT_AMBULATORY_CARE_PROVIDER_SITE_OTHER): Payer: 59

## 2015-11-13 DIAGNOSIS — E785 Hyperlipidemia, unspecified: Secondary | ICD-10-CM | POA: Diagnosis not present

## 2015-11-13 DIAGNOSIS — I10 Essential (primary) hypertension: Secondary | ICD-10-CM | POA: Diagnosis not present

## 2015-11-14 LAB — BASIC METABOLIC PANEL
BUN: 13 mg/dL (ref 6–23)
CO2: 29 mEq/L (ref 19–32)
Calcium: 9.8 mg/dL (ref 8.4–10.5)
Chloride: 102 mEq/L (ref 96–112)
Creatinine, Ser: 1.2 mg/dL (ref 0.40–1.50)
GFR: 67.09 mL/min (ref >60.00)
Glucose, Bld: 88 mg/dL (ref 70–99)
Potassium: 4.2 mEq/L (ref 3.5–5.1)
SODIUM: 137 meq/L (ref 135–145)

## 2015-11-14 LAB — LIPID PANEL
CHOLESTEROL: 187 mg/dL (ref 0–200)
HDL: 41.8 mg/dL (ref >39.00)
LDL CALC: 108 mg/dL — AB (ref 0–99)
NonHDL: 145.36
TRIGLYCERIDES: 185 mg/dL — AB (ref 0.0–149.0)
Total CHOL/HDL Ratio: 4
VLDL: 37 mg/dL (ref 0.0–40.0)

## 2015-11-14 LAB — MICROALBUMIN / CREATININE URINE RATIO
CREATININE, U: 89.7 mg/dL
Microalb Creat Ratio: 0.8 mg/g (ref 0.0–30.0)
Microalb, Ur: <0.7 mg/dL (ref 0.0–1.9)

## 2015-11-15 ENCOUNTER — Other Ambulatory Visit: Payer: 59

## 2015-11-15 DIAGNOSIS — Z1211 Encounter for screening for malignant neoplasm of colon: Secondary | ICD-10-CM

## 2015-11-15 LAB — FECAL OCCULT BLOOD, IMMUNOCHEMICAL: FECAL OCCULT BLD: NEGATIVE

## 2015-11-15 LAB — FECAL OCCULT BLOOD, GUAIAC: Fecal Occult Blood: NEGATIVE

## 2015-11-18 ENCOUNTER — Encounter: Payer: Self-pay | Admitting: *Deleted

## 2015-11-22 ENCOUNTER — Ambulatory Visit: Payer: 59 | Admitting: Family Medicine

## 2015-12-02 ENCOUNTER — Encounter: Payer: Self-pay | Admitting: Family Medicine

## 2015-12-02 ENCOUNTER — Ambulatory Visit (INDEPENDENT_AMBULATORY_CARE_PROVIDER_SITE_OTHER): Payer: 59 | Admitting: Family Medicine

## 2015-12-02 VITALS — BP 170/100 | HR 61 | Wt 192.0 lb

## 2015-12-02 DIAGNOSIS — R1900 Intra-abdominal and pelvic swelling, mass and lump, unspecified site: Secondary | ICD-10-CM | POA: Diagnosis not present

## 2015-12-02 DIAGNOSIS — I10 Essential (primary) hypertension: Secondary | ICD-10-CM

## 2015-12-02 MED ORDER — LISINOPRIL-HYDROCHLOROTHIAZIDE 10-12.5 MG PO TABS: 1.0000 | ORAL_TABLET | Freq: Every day | ORAL | 3 refills | Status: DC

## 2015-12-02 NOTE — Assessment & Plan Note (Signed)
I did not appreciate spigellian hernia today. Overall smaller in size than last month. Will continue to monitor for now.

## 2015-12-02 NOTE — Progress Notes (Signed)
   BP (!) 170/100 (BP Location: Right Arm, Cuff Size: Large)   Pulse 61   Wt 192 lb (87.1 kg)   SpO2 97%   BMI 27.55 kg/m    CC: f/u visit Subjective:    Patient ID: Keith KaufmannJoel T Garcon Jr., male    DOB: 01/22/1961, 55 y.o.   MRN: [id number]  HPI: Keith KaufmannJoel T Pare Jr. is a 55 y.o. male presenting on 12/02/2015 for Follow-up   Seen by Dr Adriana Simasook 7/18 after stung by yellow jacket on abdomen. At that time, blood pressure was markedly elevated to 170/90s, again today. Found abdominal wall bulge, possible spigelian hernia.   Last visit we increased lisinopril to 20mg  daily. Prior lisinopril /hctz stopped due to low blood pressures. Pt states bp at home running 130/80s regularly.   Relevant past medical, surgical, family and social history reviewed and updated as indicated. Interim medical history since our last visit reviewed. Allergies and medications reviewed and updated. Current Outpatient Prescriptions on File Prior to Visit  Medication Sig  . clotrimazole (LOTRIMIN AF) 1 % cream Apply 1 application topically 2 (two) times daily.  Marland Kitchen. ibuprofen (ADVIL,MOTRIN) 200 MG tablet Take 200 mg by mouth every 6 (six) hours as needed.  . vitamin C (ASCORBIC ACID) 500 MG tablet Take 500 mg by mouth daily.   No current facility-administered medications on file prior to visit.     Review of Systems Per HPI unless specifically indicated in ROS section     Objective:    BP (!) 170/100 (BP Location: Right Arm, Cuff Size: Large)   Pulse 61   Wt 192 lb (87.1 kg)   SpO2 97%   BMI 27.55 kg/m   Wt Readings from Last 3 Encounters:  12/02/15 192 lb (87.1 kg)  11/05/15 187 lb (84.8 kg)  10/24/15 190 lb (86.2 kg)    Physical Exam  Constitutional: He appears well-developed and well-nourished. No distress.  HENT:  Mouth/Throat: Oropharynx is clear and moist. No oropharyngeal exudate.  Cardiovascular: Normal rate, regular rhythm, normal heart sounds and intact distal pulses.   No murmur  heard. Pulmonary/Chest: Effort normal and breath sounds normal. No respiratory distress. He has no wheezes. He has no rales.  Abdominal: Soft. Bowel sounds are normal. He exhibits no distension and no mass. There is no tenderness. There is no rebound and no guarding. No hernia.  Hernia not appreciated today.  Musculoskeletal: He exhibits no edema.  Skin: Skin is warm and dry. No rash noted.  Psychiatric: He has a normal mood and affect.  Nursing note and vitals reviewed.     Assessment & Plan:   Problem List Items Addressed This Visit    Abdominal wall bulge    I did not appreciate spigellian hernia today. Overall smaller in size than last month. Will continue to monitor for now.       HTN (hypertension) - Primary    Chronic, deteriorated despite increased lisinopril to 20mg  daily. Will change back to prior antihypertensive regimen of lisinopril/hctz 10/12.5mg  daily on which he was previously well controlled. RTC 4-6 wks f/u visit. Bring home BP cuff to compare to ours.      Relevant Medications   lisinopril-hydrochlorothiazide (PRINZIDE,ZESTORETIC) 10-12.5 MG tablet    Other Visit Diagnoses   None.      Follow up plan: Return in about 6 weeks (around 01/13/2016) for follow up visit.  Eustaquio BoydenJavier Kilian Schwartz, MD

## 2015-12-02 NOTE — Patient Instructions (Addendum)
Stop plain lisinopril. Start lisinopril hctz 10/12.5mg . Continue to watch blood pressure at home. Goal is <140/90. Let me know if running to high >160. Return in 4-6 weeks for follow up visit.  Good to see you today, call us with questions.

## 2015-12-02 NOTE — Progress Notes (Signed)
Pre visit review using our clinic review tool, if applicable. No additional management support is needed unless otherwise documented below in the visit note. 

## 2015-12-02 NOTE — Assessment & Plan Note (Signed)
Chronic, deteriorated despite increased lisinopril to 20mg  daily. Will change back to prior antihypertensive regimen of lisinopril/hctz 10/12.5mg  daily on which he was previously well controlled. RTC 4-6 wks f/u visit. Bring home BP cuff to compare to ours.

## 2016-01-02 ENCOUNTER — Ambulatory Visit: Payer: 59 | Admitting: Family Medicine

## 2016-01-06 ENCOUNTER — Ambulatory Visit: Payer: 59 | Admitting: Family Medicine

## 2016-01-28 ENCOUNTER — Ambulatory Visit: Payer: 59 | Admitting: Family Medicine

## 2016-02-11 ENCOUNTER — Encounter: Payer: Self-pay | Admitting: Family Medicine

## 2016-02-11 ENCOUNTER — Ambulatory Visit (INDEPENDENT_AMBULATORY_CARE_PROVIDER_SITE_OTHER): Payer: 59 | Admitting: Family Medicine

## 2016-02-11 VITALS — BP 140/92 | HR 84 | Temp 98.2°F | Wt 189.5 lb

## 2016-02-11 DIAGNOSIS — I10 Essential (primary) hypertension: Secondary | ICD-10-CM

## 2016-02-11 NOTE — Progress Notes (Signed)
   BP (!) 140/92 (BP Location: Right Arm, Cuff Size: Large)   Pulse 84   Temp 98.2 F (36.8 C) (Oral)   Wt 189 lb 8 oz (86 kg)   BMI 27.19 kg/m    CC: 6 wk f/u visit Subjective:    Patient ID: Keith KaufmannJoel T Bisesi Jr., male    DOB: 06/01/1960, 55 y.o.   MRN: [id number]  HPI: Keith KaufmannJoel T Hosking Jr. is a 55 y.o. male presenting on 02/11/2016 for Follow-up   HTN - Compliant with current antihypertensive regimen of lisinopril hctz 10/12.5mg  daily. Does check blood pressures at home: 126-130/80, overall better controlled. No low blood pressure readings or symptoms of dizziness/syncope. Denies HA, vision changes, CP/tightness, SOB, leg swelling.   Relevant past medical, surgical, family and social history reviewed and updated as indicated. Interim medical history since our last visit reviewed. Allergies and medications reviewed and updated. Current Outpatient Prescriptions on File Prior to Visit  Medication Sig  . clotrimazole (LOTRIMIN AF) 1 % cream Apply 1 application topically 2 (two) times daily.  Marland Kitchen. ibuprofen (ADVIL,MOTRIN) 200 MG tablet Take 200 mg by mouth every 6 (six) hours as needed.  Marland Kitchen. lisinopril-hydrochlorothiazide (PRINZIDE,ZESTORETIC) 10-12.5 MG tablet Take 1 tablet by mouth daily.  . TURMERIC PO Take 1 tablet by mouth daily as needed.  . vitamin C (ASCORBIC ACID) 500 MG tablet Take 500 mg by mouth daily.   No current facility-administered medications on file prior to visit.     Review of Systems Per HPI unless specifically indicated in ROS section     Objective:    BP (!) 140/92 (BP Location: Right Arm, Cuff Size: Large)   Pulse 84   Temp 98.2 F (36.8 C) (Oral)   Wt 189 lb 8 oz (86 kg)   BMI 27.19 kg/m   Wt Readings from Last 3 Encounters:  02/11/16 189 lb 8 oz (86 kg)  12/02/15 192 lb (87.1 kg)  11/05/15 187 lb (84.8 kg)    Physical Exam  Constitutional: He appears well-developed and well-nourished. No distress.  HENT:  Head: Normocephalic and atraumatic.    Mouth/Throat: Oropharynx is clear and moist. No oropharyngeal exudate.  Cardiovascular: Normal rate, regular rhythm, normal heart sounds and intact distal pulses.   No murmur heard. Pulmonary/Chest: Effort normal and breath sounds normal. No respiratory distress. He has no wheezes. He has no rales.  Musculoskeletal: He exhibits no edema.  Skin: Skin is warm and dry. No rash noted.  Psychiatric: He has a normal mood and affect.  Nursing note and vitals reviewed.      Assessment & Plan:   Problem List Items Addressed This Visit    HTN (hypertension)    bp elevated today however pt endorses home readings averaging 120-130/80s. No changes today. Pt will continue to monitor bp at home and notify me if persistently high, pt will continue healthy diet changes. Reassess at CPE.        Other Visit Diagnoses   None.      Follow up plan: Return in about 5 months (around 07/11/2016) for annual exam, prior fasting for blood work.  Eustaquio BoydenJavier Rhylei Mcquaig, MD

## 2016-02-11 NOTE — Progress Notes (Signed)
Pre visit review using our clinic review tool, if applicable. No additional management support is needed unless otherwise documented below in the visit note. 

## 2016-02-11 NOTE — Assessment & Plan Note (Signed)
bp elevated today however pt endorses home readings averaging 120-130/80s. No changes today. Pt will continue to monitor bp at home and notify me if persistently high, pt will continue healthy diet changes. Reassess at CPE.

## 2016-02-11 NOTE — Patient Instructions (Addendum)
Goal blood pressure <140/90 Today was a bit high. No changes today however. Continue checking and let me know if consistently >140/90. Otherwise we will recheck at next physical.  Work on low salt/sodium diet - goal <1.5gm (1,500mg ) per day. Eat a diet high in fruits/vegetables and whole grains.  Look into mediterranean and DASH diet. Goal activity is 17450min/wk of moderate intensity exercise.  This can be split into 30 minute chunks.  If you are not at this level, you can start with smaller 10-15 min increments and slowly build up activity. Look at www.heart.org for more resources   Return in 5 months for physical.

## 2016-02-24 IMAGING — CR DG LUMBAR SPINE COMPLETE 4+V
5 series · 5 of 5 positions shown · non-contrast
Comparison: None.

CLINICAL DATA: Back pain with right radiculopathy.

EXAM:
LUMBAR SPINE - COMPLETE 4+ VIEW

[view not recorded (1 of 5)]
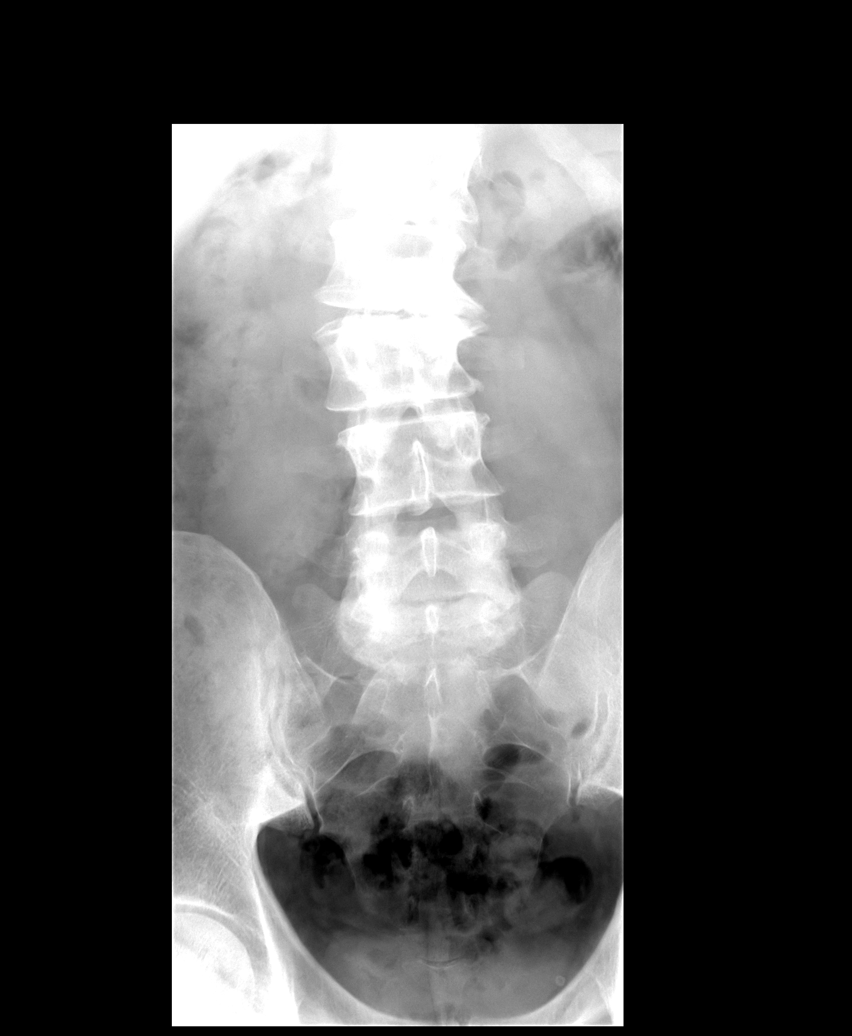

[view not recorded (2 of 5)]
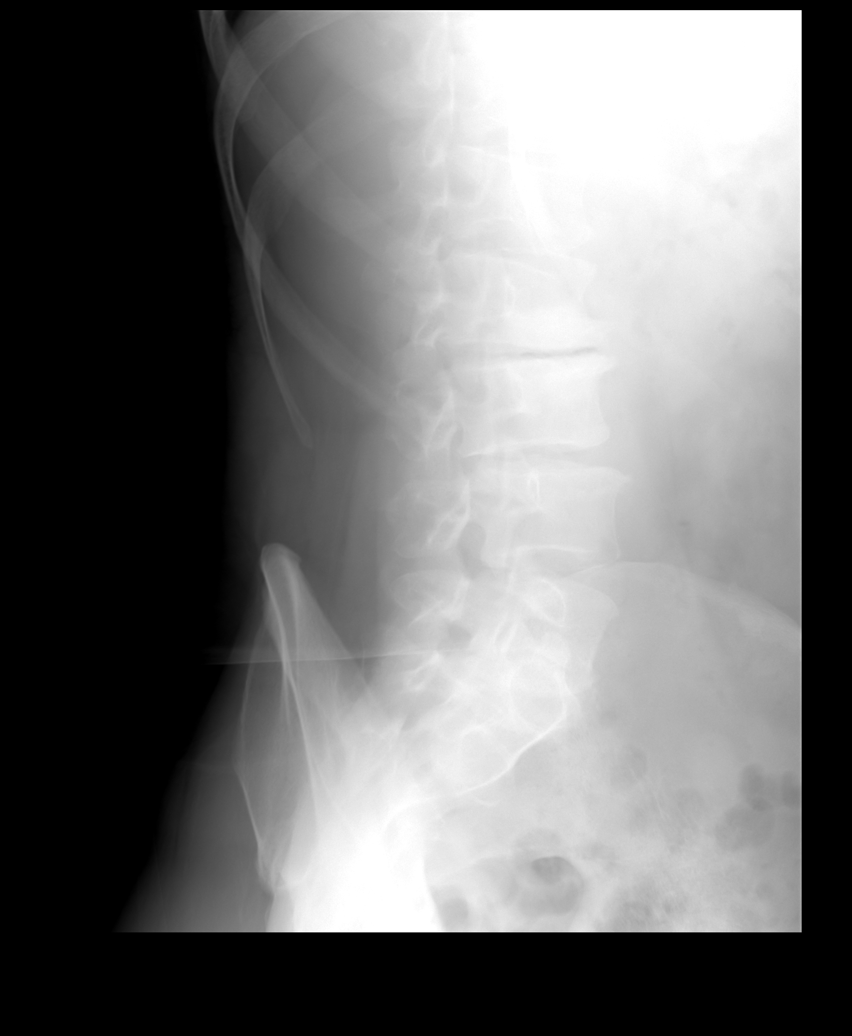

[view not recorded (3 of 5)]
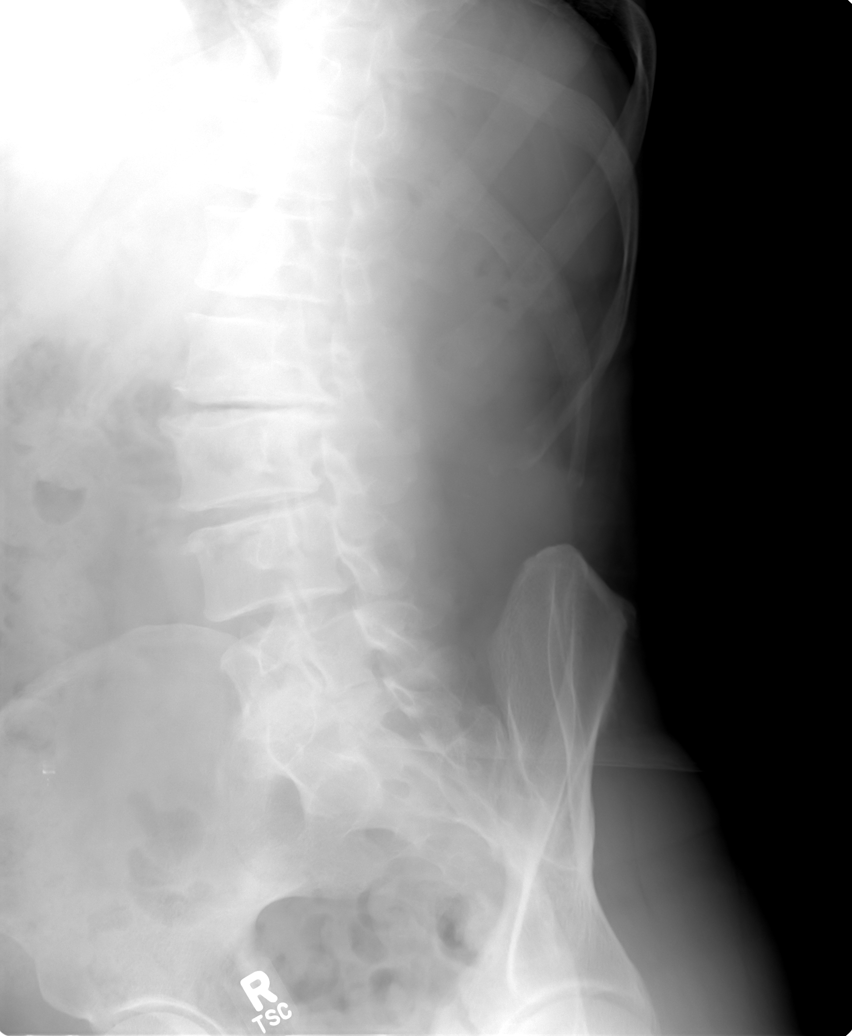

[view not recorded (4 of 5)]
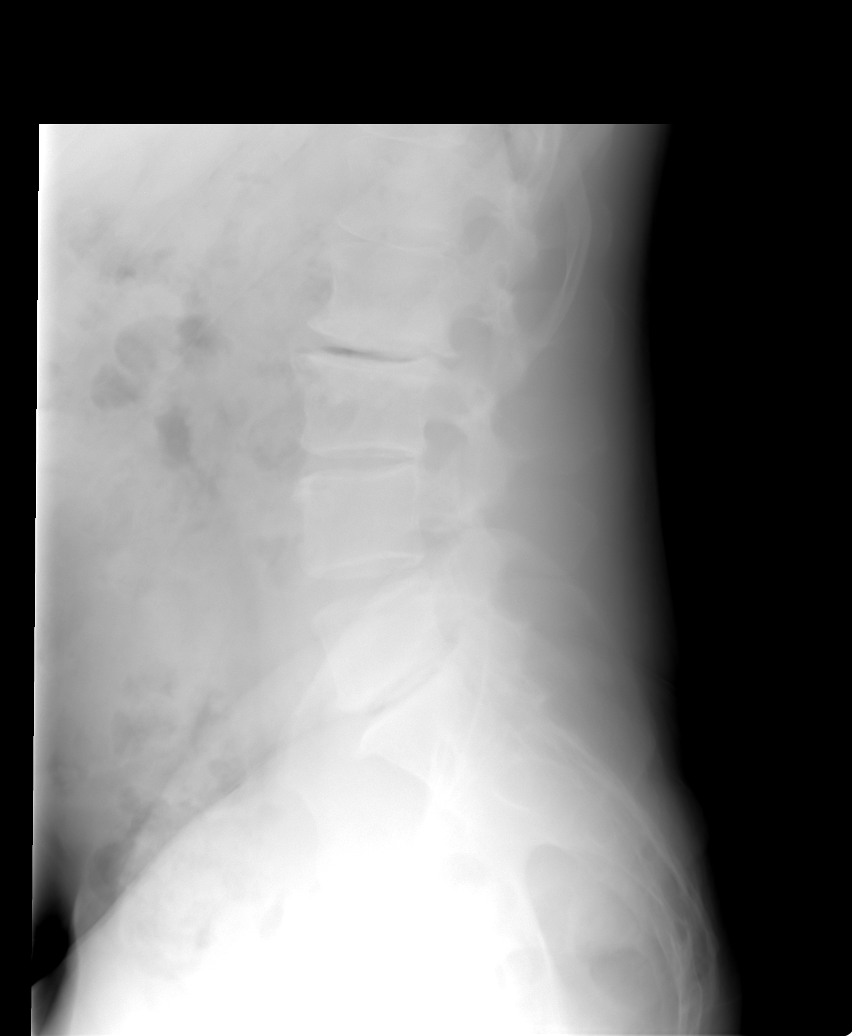

[view not recorded (5 of 5)]
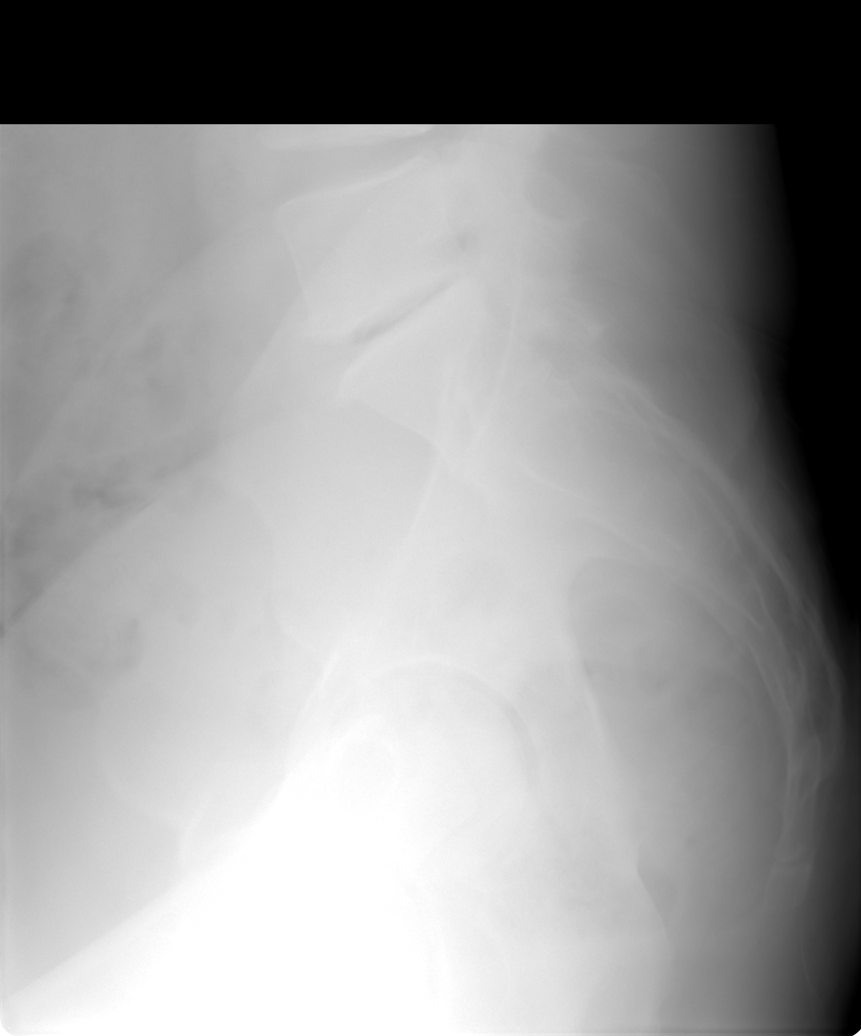

[5 of 5 positions shown; findings below may reference images not displayed]

FINDINGS: No fracture. No spondylolisthesis. Moderate loss of disc height at
L2-L3 with mild loss of disc height at L3-L4 and L4-L5 and moderate
loss of disc height at L5-S1. Facet joints are relatively well
preserved.

Mild dextroscoliosis noted with the apex at L2-L3.

There are transitional lumbosacral and thoracolumbar vertebrae.
IMPRESSION: No fracture or acute finding.  Degenerative changes as detailed.

## 2016-09-14 ENCOUNTER — Other Ambulatory Visit: Payer: Self-pay | Admitting: Family Medicine

## 2016-10-15 ENCOUNTER — Other Ambulatory Visit: Payer: Self-pay | Admitting: Family Medicine

## 2016-10-15 NOTE — Telephone Encounter (Signed)
Last Rx indicates pt is to schedule CPE before next refill. Last OV 01/2016

## 2017-01-11 ENCOUNTER — Other Ambulatory Visit: Payer: Self-pay | Admitting: Family Medicine

## 2017-02-15 ENCOUNTER — Other Ambulatory Visit: Payer: Self-pay | Admitting: Family Medicine

## 2017-04-07 ENCOUNTER — Encounter: Payer: Self-pay | Admitting: Family Medicine

## 2017-04-07 ENCOUNTER — Ambulatory Visit (INDEPENDENT_AMBULATORY_CARE_PROVIDER_SITE_OTHER): Payer: Managed Care, Other (non HMO) | Admitting: Family Medicine

## 2017-04-07 ENCOUNTER — Ambulatory Visit: Payer: Self-pay | Admitting: *Deleted

## 2017-04-07 VITALS — BP 156/94 | HR 60 | Temp 98.2°F | Wt 195.0 lb

## 2017-04-07 DIAGNOSIS — T7840XA Allergy, unspecified, initial encounter: Secondary | ICD-10-CM | POA: Diagnosis not present

## 2017-04-07 DIAGNOSIS — L089 Local infection of the skin and subcutaneous tissue, unspecified: Secondary | ICD-10-CM

## 2017-04-07 MED ORDER — CEPHALEXIN 500 MG PO CAPS: 500.0000 mg | ORAL_CAPSULE | Freq: Three times a day (TID) | ORAL | 0 refills | Status: DC

## 2017-04-07 NOTE — Progress Notes (Signed)
Subjective:    Patient ID: Keith KaufmannJoel T Collyer Lawson., male    DOB: 04/04/1961, 56 y.o.   MRN: [id number]  HPI  Here for itchy eyes and a rash   Woke up Tuesday and his R eye was swollen and irritated  Itchy  No blurry vision  No headache  No tearing or discharge  Just eye lid bothers him -not the lid   Now his L eyelid is also itchy   Now the R side of nose feels tingly/different   Today a rash - on left side of face -not involving eye - extends over to the ear  Wears a hard hat and that irritates it   Has not felt sick or under the weather    bp is up today (white coat syndrome)-it is good at home  BP Readings from Last 3 Encounters:  04/07/17 (!) 156/94  02/11/16 (!) 140/92  12/02/15 (!) 170/100    Patient Active Problem List   Diagnosis Date Noted  . Allergic reaction 04/07/2017  . Abdominal wall bulge 11/05/2015  . Back pain 10/05/2013  . Healthcare maintenance 02/02/2013  . Prediabetes 02/02/2013  . HLD (hyperlipidemia) 02/02/2013  . Seasonal allergic rhinitis   . HTN (hypertension)    Past Medical History:  Diagnosis Date  . Dyslipidemia   . History of asthma    as a child  . HTN (hypertension)   . Seasonal allergic rhinitis    nasal congestion   History reviewed. No pertinent surgical history. Social History   Tobacco Use  . Smoking status: Never Smoker  . Smokeless tobacco: Never Used  Substance Use Topics  . Alcohol use: No  . Drug use: No   Family History  Problem Relation Age of Onset  . CAD Father [id number]       MI  . Hypertension Father   . Hypertension Paternal Grandmother   . Stroke Neg Hx   . Cancer Neg Hx   . Diabetes Neg Hx    Allergies  Allergen Reactions  . Atorvastatin Other (See Comments)    Myalgias, arthralgias  . Prednisone Itching   Current Outpatient Medications on File Prior to Visit  Medication Sig Dispense Refill  . clotrimazole (LOTRIMIN AF) 1 % cream Apply 1 application topically 2 (two) times daily. 45 g 0  .  ibuprofen (ADVIL,MOTRIN) 200 MG tablet Take 200 mg by mouth every 6 (six) hours as needed.    Marland Kitchen. lisinopril-hydrochlorothiazide (PRINZIDE,ZESTORETIC) 10-12.5 MG tablet TAKE 1 TABLET BY MOUTH EVERY DAY 30 tablet 5  . TURMERIC PO Take 1 tablet by mouth daily as needed.    . vitamin C (ASCORBIC ACID) 500 MG tablet Take 500 mg by mouth daily.     No current facility-administered medications on file prior to visit.     Review of Systems  Constitutional: Negative for activity change, appetite change, fatigue, fever and unexpected weight change.  HENT: Negative for congestion, rhinorrhea, sore throat and trouble swallowing.   Eyes: Negative for pain, redness, itching and visual disturbance.  Respiratory: Negative for cough, chest tightness, shortness of breath and wheezing.   Cardiovascular: Negative for chest pain and palpitations.  Gastrointestinal: Negative for abdominal pain, blood in stool, constipation, diarrhea and nausea.  Endocrine: Negative for cold intolerance, heat intolerance, polydipsia and polyuria.  Genitourinary: Negative for difficulty urinating, dysuria, frequency and urgency.  Musculoskeletal: Negative for arthralgias, joint swelling and myalgias.  Skin: Positive for rash. Negative for pallor and wound.  Pos for itching of eyelids   Neurological: Negative for dizziness, tremors, weakness, numbness and headaches.  Hematological: Negative for adenopathy. Does not bruise/bleed easily.  Psychiatric/Behavioral: Negative for decreased concentration and dysphoric mood. The patient is not nervous/anxious.        Objective:   Physical Exam  Constitutional: He appears well-developed and well-nourished. No distress.  HENT:  Head: Normocephalic and atraumatic.  Right Ear: External ear normal.  Left Ear: External ear normal.  Nose: Nose normal.  Mouth/Throat: Oropharynx is clear and moist. No oropharyngeal exudate.  Eyes: Conjunctivae and EOM are normal. Pupils are equal, round,  and reactive to light. Right eye exhibits no discharge. Left eye exhibits no discharge. No scleral icterus.  Bilateral mild upper eyelid swelling  No eye or vision changes otherwise No styes nor wounds noted  No d/c     Neck: Normal range of motion. Neck supple. No JVD present.  Cardiovascular: Normal rate, regular rhythm and normal heart sounds.  Pulmonary/Chest: Effort normal and breath sounds normal. No respiratory distress. He has no wheezes. He has no rales.  Musculoskeletal: He exhibits no tenderness.  Lymphadenopathy:    He has no cervical adenopathy.  Neurological: He is alert. No cranial nerve deficit. He exhibits normal muscle tone. Coordination normal.  Skin: Skin is warm and dry. Rash noted. There is erythema.  Pt has a very ruddy complexion -difficult to assess for skin erythema   Patchy rash with scale in L temple area and also behind L ear  Area behind ear (quarter size) has small raised area with golden crust resembling impetigo   Psychiatric: He has a normal mood and affect.          Assessment & Plan:   Problem List Items Addressed This Visit      Musculoskeletal and Integument   Skin infection    Small area of rash behind L ear infected (resembling impetigo) tx with keflex tid for 7d Update if not starting to improve in a week or if worsening        Relevant Medications   cephALEXin (KEFLEX) 500 MG capsule     Other   Allergic reaction - Primary    Facial redness/ eyelid swelling and L sided rash since changing facial soap to dial  Adv to change facial cleanser to dove soap for sensitive skin or cetaphil Avoid hot water and harsh detergents  Antihistamine prn for rash  Alert us asap if worse symptoms

## 2017-04-07 NOTE — Patient Instructions (Addendum)
I think you may be having an allergic reaction to soap (Dial)  Please change to dove soap for sensitive skin - for face and body  Another choice is cetaphil cleanser   For itching - zyrtec over the counter 10 mg once daily   The area of skin behind ear looks especially irritated  You may have an early skin infection there  We will start keflex three times daily for 7 days   I don't think this is shingles but if the rash becomes painful please let us know right away   Update if not starting to improve in a week or if worsening

## 2017-04-07 NOTE — Telephone Encounter (Signed)
ititially started with itchy eyes yesterday; today patchy blisters that started around his ears this morning and on forehead this afternoon; pt states that his eyes remain itcy; pt states that above his right eye is puffy butis not painful; nurse triage initiated per protocol; pt offered and accepted appointment with Dr Milinda Antisower today at 1630 and verbalizes understanding; [facility] has  Not been seen since October 2017. Reason for Disposition . [1] Looks infected (spreading redness, pus) AND [2] no fever  Answer Assessment - Initial Assessment Questions 1. APPEARANCE of RASH: "Describe the rash."      Blister-like, raised, reddened 2. LOCATION: "Where is the rash located?"       Back side of ears and on forhead 3. NUMBER: "How many spots are there?"      unknown 4. SIZE: "How big are the spots?" (Inches, centimeters or compare to size of a coin)      1 inch round 5. ONSET: "When did the rash start?"      04/06/17 6. ITCHING: "Does the rash itch?" If so, ask: "How bad is the itch?"  (Scale 1-10; or mild, moderate, severe)     severe 7. PAIN: "Does the rash hurt?" If so, ask: "How bad is the pain?"  (Scale 1-10; or mild, moderate, severe)     no 8. OTHER SYMPTOMS: "Do you have any other symptoms?" (e.g., fever)     Itchy eyes 9. PREGNANCY: "Is there any chance you are pregnant?" "When was your last menstrual period?"     n/a  Protocols used: RASH OR REDNESS - LOCALIZED-A-AH

## 2017-04-08 DIAGNOSIS — L089 Local infection of the skin and subcutaneous tissue, unspecified: Secondary | ICD-10-CM | POA: Insufficient documentation

## 2017-04-08 NOTE — Assessment & Plan Note (Signed)
Small area of rash behind L ear infected (resembling impetigo) tx with keflex tid for 7d Update if not starting to improve in a week or if worsening

## 2017-04-08 NOTE — Assessment & Plan Note (Signed)
Facial redness/ eyelid swelling and L sided rash since changing facial soap to dial  Adv to change facial cleanser to dove soap for sensitive skin or cetaphil Avoid hot water and harsh detergents  Antihistamine prn for rash  Alert us asap if worse symptoms

## 2017-08-20 ENCOUNTER — Other Ambulatory Visit: Payer: Self-pay | Admitting: Family Medicine

## 2017-10-19 ENCOUNTER — Other Ambulatory Visit: Payer: Self-pay | Admitting: Family Medicine

## 2017-11-17 ENCOUNTER — Other Ambulatory Visit: Payer: Self-pay | Admitting: Family Medicine

## 2017-12-11 ENCOUNTER — Other Ambulatory Visit: Payer: Self-pay | Admitting: Family Medicine

## 2018-01-11 ENCOUNTER — Other Ambulatory Visit: Payer: Self-pay | Admitting: Family Medicine

## 2018-02-09 ENCOUNTER — Other Ambulatory Visit: Payer: Self-pay | Admitting: Family Medicine

## 2018-03-02 ENCOUNTER — Ambulatory Visit (INDEPENDENT_AMBULATORY_CARE_PROVIDER_SITE_OTHER): Payer: BLUE CROSS/BLUE SHIELD | Admitting: Family Medicine

## 2018-03-02 ENCOUNTER — Encounter: Payer: Self-pay | Admitting: Family Medicine

## 2018-03-02 VITALS — BP 146/100 | HR 65 | Temp 98.2°F | Ht 70.0 in | Wt 192.5 lb

## 2018-03-02 DIAGNOSIS — Z125 Encounter for screening for malignant neoplasm of prostate: Secondary | ICD-10-CM | POA: Diagnosis not present

## 2018-03-02 DIAGNOSIS — E785 Hyperlipidemia, unspecified: Secondary | ICD-10-CM

## 2018-03-02 DIAGNOSIS — I10 Essential (primary) hypertension: Secondary | ICD-10-CM | POA: Diagnosis not present

## 2018-03-02 DIAGNOSIS — Z1211 Encounter for screening for malignant neoplasm of colon: Secondary | ICD-10-CM | POA: Diagnosis not present

## 2018-03-02 DIAGNOSIS — M79604 Pain in right leg: Secondary | ICD-10-CM | POA: Insufficient documentation

## 2018-03-02 MED ORDER — LISINOPRIL-HYDROCHLOROTHIAZIDE 20-12.5 MG PO TABS: 1.0000 | ORAL_TABLET | Freq: Every day | ORAL | 3 refills | Status: DC

## 2018-03-02 NOTE — Assessment & Plan Note (Signed)
Unclear cause. No current pain today, no reproducible tenderness. I suggested he return when in active flare for exam with myself or SM.

## 2018-03-02 NOTE — Assessment & Plan Note (Signed)
Chronic, off lipitor. Update FLP.  The 10-year ASCVD risk score [name](Goff DC Montez HagemanJr., et al., 2013) is: 9.7%   Values used to calculate the score:     Age: [age] years     Sex: Male     Is Non-Hispanic African American: No     Diabetic: No     Tobacco smoker: No     Systolic Blood Pressure: 146 mmHg     Is BP treated: Yes     HDL Cholesterol: 41.8 mg/dL     Total Cholesterol: 187 mg/dL

## 2018-03-02 NOTE — Patient Instructions (Addendum)
Pass by lab for blood work and a stool kit.  Higher dose refilled (lisinopril 81m, hydrochlorothiazide 12.565m.  Return if you can during active right leg flare for evaluation and see me or Dr CoLorelei [facility]ports medicine).  Return as needed or in 1 year for physical.

## 2018-03-02 NOTE — Progress Notes (Signed)
BP (!) 146/100 (BP Location: Right Arm, Patient Position: Sitting, Cuff Size: Normal)   Pulse 65   Temp 98.2 F (36.8 C) (Oral)   Ht 5\' 10"  (1.778 m)   Wt 192 lb 8 oz (87.3 kg)   SpO2 97%   BMI 27.62 kg/m    CC: med refill visit Subjective:    Patient ID: Keith Kaufmann., male    DOB: Jul 17, 1960, 57 y.o.   MRN: [id number]  HPI: Keith Kunesh. is a 57 y.o. male presenting on 03/02/2018 for Medication Refill   Last seen 01/2016.  HTN - Compliant with current antihypertensive regimen of lisinopril/hctz 10/12.5mg . Does check blood pressures at home once a week - 135-140/88-90. No low blood pressure readings or symptoms of dizziness/syncope.  Denies HA, vision changes, CP/tightness, SOB, leg swelling.    Fasting today.  Colon cancer screening - agrees to iFOB.   Ongoing R leg pain from R buttock to lateral knee. No numbness or weakness of leg, no back pain, no bowel/bladder incontinence. Previously tried NSAID and gabapentin. On his feet on concrete all day. He does take several advil per day for R leg pain. Has actually found daily turmeric beneficial  Relevant past medical, surgical, family and social history reviewed and updated as indicated. Interim medical history since our last visit reviewed. Allergies and medications reviewed and updated. Outpatient Medications Prior to Visit  Medication Sig Dispense Refill  . ibuprofen (ADVIL,MOTRIN) 200 MG tablet Take 200 mg by mouth every 6 (six) hours as needed.    . TURMERIC PO Take 1 tablet by mouth daily as needed.    . vitamin C (ASCORBIC ACID) 500 MG tablet Take 500 mg by mouth daily.    Marland Kitchen lisinopril-hydrochlorothiazide (PRINZIDE,ZESTORETIC) 10-12.5 MG tablet TAKE 1 TABLET BY MOUTH EVERY DAY 30 tablet 0  . cephALEXin (KEFLEX) 500 MG capsule Take 1 capsule (500 mg total) by mouth 3 (three) times daily. 21 capsule 0  . clotrimazole (LOTRIMIN AF) 1 % cream Apply 1 application topically 2 (two) times daily. 45 g 0   No  facility-administered medications prior to visit.      Per HPI unless specifically indicated in ROS section below Review of Systems     Objective:    BP (!) 146/100 (BP Location: Right Arm, Patient Position: Sitting, Cuff Size: Normal)   Pulse 65   Temp 98.2 F (36.8 C) (Oral)   Ht 5\' 10"  (1.778 m)   Wt 192 lb 8 oz (87.3 kg)   SpO2 97%   BMI 27.62 kg/m   Wt Readings from Last 3 Encounters:  03/02/18 192 lb 8 oz (87.3 kg)  04/07/17 195 lb (88.5 kg)  02/11/16 189 lb 8 oz (86 kg)    Physical Exam  Constitutional: He appears well-developed and well-nourished. No distress.  HENT:  Mouth/Throat: Oropharynx is clear and moist. No oropharyngeal exudate.  Cardiovascular: Normal rate, regular rhythm and normal heart sounds.  No murmur heard. Pulmonary/Chest: Effort normal and breath sounds normal. No respiratory distress. He has no wheezes. He has no rales.  Musculoskeletal: Normal range of motion. He exhibits no edema.  No pain midline spine No paraspinous mm tenderness Neg SLR bilaterally. No pain with int/ext rotation at hip. Neg FABER. No pain at SIJ, GTB or sciatic notch bilaterally.   Neurological: He is alert.  5/5 strength BLE Sensation intact bilaterally  Nursing note and vitals reviewed.  Results for orders placed or performed in visit on 11/18/15  Fecal Occult  Blood, Guaiac  Result Value Ref Range   Fecal Occult Blood Negative       Assessment & Plan:   Problem List Items Addressed This Visit    Right leg pain    Unclear cause. No current pain today, no reproducible tenderness. I suggested he return when in active flare for exam with myself or SM.      HTN (hypertension) - Primary    Chronic, bp remaining elevated - will increase medication to lisinopril 20mg , hctz 12.5mg . Pt will monitor BP at home. Reviewed signs of low BP readings.       Relevant Medications   lisinopril-hydrochlorothiazide (ZESTORETIC) 20-12.5 MG tablet   Other Relevant Orders    Basic metabolic panel   HLD (hyperlipidemia)    Chronic, off lipitor. Update FLP.  The 10-year ASCVD risk score [name](Goff DC Montez HagemanJr., et al., 2013) is: 9.7%   Values used to calculate the score:     Age: [age] years     Sex: Male     Is Non-Hispanic African American: No     Diabetic: No     Tobacco smoker: No     Systolic Blood Pressure: 146 mmHg     Is BP treated: Yes     HDL Cholesterol: 41.8 mg/dL     Total Cholesterol: 187 mg/dL       Relevant Medications   lisinopril-hydrochlorothiazide (ZESTORETIC) 20-12.5 MG tablet   Other Relevant Orders   Lipid panel    Other Visit Diagnoses    Special screening for malignant neoplasm of prostate       Relevant Orders   PSA   Special screening for malignant neoplasms, colon       Relevant Orders   Fecal occult blood, imunochemical       Meds ordered this encounter  Medications  . lisinopril-hydrochlorothiazide (ZESTORETIC) 20-12.5 MG tablet    Sig: Take 1 tablet by mouth daily.    Dispense:  90 tablet    Refill:  3   Orders Placed This Encounter  Procedures  . Fecal occult blood, imunochemical    Standing Status:   Future    Standing Expiration Date:   03/03/2019  . Lipid panel  . Basic metabolic panel  . PSA    Follow up plan: Return in about 1 year (around 03/03/2019) for annual exam, prior fasting for blood work.  Keith BoydenJavier Cheyan Frees, MD

## 2018-03-02 NOTE — Assessment & Plan Note (Signed)
Chronic, bp remaining elevated - will increase medication to lisinopril 20mg , hctz 12.5mg . Pt will monitor BP at home. Reviewed signs of low BP readings.

## 2018-03-03 LAB — LIPID PANEL
CHOL/HDL RATIO: 4
Cholesterol: 189 mg/dL (ref 0–200)
HDL: 51.9 mg/dL (ref >39.00)
LDL CALC: 112 mg/dL — AB (ref 0–99)
NONHDL: 137.22
Triglycerides: 124 mg/dL (ref 0.0–149.0)
VLDL: 24.8 mg/dL (ref 0.0–40.0)

## 2018-03-03 LAB — BASIC METABOLIC PANEL
BUN: 18 mg/dL (ref 6–23)
CO2: 29 mEq/L (ref 19–32)
Calcium: 10 mg/dL (ref 8.4–10.5)
Chloride: 102 mEq/L (ref 96–112)
Creatinine, Ser: 1.18 mg/dL (ref 0.40–1.50)
GFR: 67.82 mL/min (ref >60.00)
Glucose, Bld: 97 mg/dL (ref 70–99)
POTASSIUM: 4.5 meq/L (ref 3.5–5.1)
SODIUM: 139 meq/L (ref 135–145)

## 2018-03-03 LAB — PSA: PSA: 0.67 ng/mL (ref 0.10–4.00)

## 2018-03-09 ENCOUNTER — Encounter: Payer: Self-pay | Admitting: Family Medicine

## 2018-04-27 ENCOUNTER — Ambulatory Visit: Payer: BLUE CROSS/BLUE SHIELD | Admitting: Family Medicine

## 2018-11-19 ENCOUNTER — Other Ambulatory Visit: Payer: Self-pay | Admitting: Family Medicine

## 2019-03-13 ENCOUNTER — Encounter: Payer: Self-pay | Admitting: Family Medicine

## 2019-03-13 ENCOUNTER — Telehealth: Payer: Self-pay | Admitting: Family Medicine

## 2019-03-13 ENCOUNTER — Ambulatory Visit (INDEPENDENT_AMBULATORY_CARE_PROVIDER_SITE_OTHER): Payer: BC Managed Care – PPO | Admitting: Family Medicine

## 2019-03-13 VITALS — Temp 98.5°F | Ht 70.0 in | Wt 195.0 lb

## 2019-03-13 DIAGNOSIS — R0981 Nasal congestion: Secondary | ICD-10-CM | POA: Diagnosis not present

## 2019-03-13 MED ORDER — FLUTICASONE PROPIONATE 50 MCG/ACT NA SUSP: 2.0000 | Freq: Every day | NASAL | 3 refills | Status: DC

## 2019-03-13 NOTE — Progress Notes (Signed)
Virtual visit completed through [employer]. Due to national recommendations of social distancing due to COVID-19, a virtual visit is felt to be most appropriate for this patient at this time. Reviewed limitations of a virtual visit.   Patient location: at work Provider location: Adult nurse at [facility], office If any vitals were documented, they were collected by patient at home unless specified below.    Temp 98.5 F (36.9 C)   Ht 5\' 10"  (1.778 m)   Wt 195 lb (88.5 kg)   BMI 27.98 kg/m    CC: sinus pressure Subjective:    Patient ID: ., male    DOB: 15-Sep-1960, 58 y.o.   MRN: [id number]  HPI: Keith Tietje. is a 58 y.o. male presenting on 03/13/2019 for Sinus Problem (C/o sinus pressure, drainage down throat causing cough.  Sxs started 2 days ago.  Denies fever. Tried neti pot, helpful. )   2d h/o sinus pressure, PNdrainage, congestion, mild cough of drainage.  Symptoms started after blowing leaves in yard.   No fevers/chills, malaise, dyspnea, rhinorrhea, loss of taste/smell, nausea, abd pain, diarrhea. No HA.  No sick contacts at home.  No h/o asthma.  Non smoker.  Treating with neti pot and OTC chlorpheniramine with mild benefit.       Relevant past medical, surgical, family and social history reviewed and updated as indicated. Interim medical history since our last visit reviewed. Allergies and medications reviewed and updated. Outpatient Medications Prior to Visit  Medication Sig Dispense Refill  . ibuprofen (ADVIL,MOTRIN) 200 MG tablet Take 200 mg by mouth every 6 (six) hours as needed.    03/15/2019 lisinopril-hydrochlorothiazide (ZESTORETIC) 20-12.5 MG tablet TAKE 1 TABLET BY MOUTH EVERY DAY 90 tablet 1  . TURMERIC PO Take 1 tablet by mouth daily as needed.    . vitamin C (ASCORBIC ACID) 500 MG tablet Take 500 mg by mouth daily.     No facility-administered medications prior to visit.      Per HPI unless specifically indicated in ROS section below  Review of Systems Objective:    Temp 98.5 F (36.9 C)   Ht 5\' 10"  (1.778 m)   Wt 195 lb (88.5 kg)   BMI 27.98 kg/m   Wt Readings from Last 3 Encounters:  03/13/19 195 lb (88.5 kg)  03/02/18 192 lb 8 oz (87.3 kg)  04/07/17 195 lb (88.5 kg)     Physical exam: Gen: alert, NAD, not ill appearing Pulm: speaks in complete sentences without increased work of breathing Psych: normal mood, normal thought content      Results for orders placed or performed in visit on 03/02/18  Lipid panel  Result Value Ref Range   Cholesterol 189 0 - 200 mg/dL   Triglycerides 04/09/17 0.0 - 149.0 mg/dL   HDL 03/04/18 924.2 mg/dL   VLDL 68.34 0.0 - >19.62 mg/dL   LDL Cholesterol 22.9 (H) 0 - 99 mg/dL   Total CHOL/HDL Ratio 4    NonHDL 137.22   Basic metabolic panel  Result Value Ref Range   Sodium 139 135 - 145 mEq/L   Potassium 4.5 3.5 - 5.1 mEq/L   Chloride 102 96 - 112 mEq/L   CO2 29 19 - 32 mEq/L   Glucose, Bld 97 70 - 99 mg/dL   BUN 18 6 - 23 mg/dL   Creatinine, Ser 79.8 0.40 - 1.50 mg/dL   Calcium 921 8.4 - 1.94 mg/dL   GFR 17.4 08.1 mL/min  PSA  Result  Value Ref Range   PSA 0.67 0.10 - 4.00 ng/mL   Assessment & Plan:   Problem List Items Addressed This Visit    Sinus congestion - Primary    2d h/o sinus congestion with PNdrainage and cough likely allergy related however in setting of current covid pandemic with recent spike in cases locally, did recommend he go get nasal swab and self quarantine until results return. He will touch base with work to get tested today.           Meds ordered this encounter  Medications  . fluticasone (FLONASE) 50 MCG/ACT nasal spray    Sig: Place 2 sprays into both nostrils daily.    Dispense:  16 g    Refill:  3   No orders of the defined types were placed in this encounter.   I discussed the assessment and treatment plan with the patient. The patient was provided an opportunity to ask questions and all were answered. The patient agreed with  the plan and demonstrated an understanding of the instructions. The patient was advised to call back or seek an in-person evaluation if the symptoms worsen or if the condition fails to improve as anticipated.  Follow up plan: Return if symptoms worsen or fail to improve.  Keith Bush, MD

## 2019-03-13 NOTE — Telephone Encounter (Signed)
See recent OV

## 2019-03-13 NOTE — Assessment & Plan Note (Signed)
2d h/o sinus congestion with PNdrainage and cough likely allergy related however in setting of current covid pandemic with recent spike in cases locally, did recommend he go get nasal swab and self quarantine until results return. He will touch base with work to get tested today.

## 2019-03-20 ENCOUNTER — Telehealth: Payer: Self-pay | Admitting: Family Medicine

## 2019-03-20 MED ORDER — AMOXICILLIN-POT CLAVULANATE 875-125 MG PO TABS: 1.0000 | ORAL_TABLET | Freq: Two times a day (BID) | ORAL | 0 refills | Status: AC

## 2019-03-20 NOTE — Telephone Encounter (Signed)
Patient's wife,Robin, called.  Patient was seen last week and told to get a covid test.  The covid test was negative and they would like to know if a rx can be called in to [facility] for a sinus infection.

## 2019-03-20 NOTE — Addendum Note (Signed)
Addended by: Ria Bush on: 03/20/2019 04:59 PM   Modules accepted: Orders

## 2019-03-20 NOTE — Telephone Encounter (Signed)
Spoke with pt asking about sxs.  States he has sinus pressure, facial pain and spitting out brown drainage.  Had COVID test done with [facility].  Says he will provide a copy of the report.  I notified pt rx was sent to pharmacy and I relayed Dr. Synthia Innocent instructions.  Pt verbalizes understanding.

## 2019-03-20 NOTE — Telephone Encounter (Signed)
I'm glad test came back negative.  plz get specific symptoms he's experiencing now.  10+ days of sinus symptoms. As not improving, reasonable to treat for possible sinus infection. I have sent augmentin antibiotic to pharmacy. Let us know if not improving with this.  Continue flonase nasal spray.   Where was he tested?

## 2019-03-24 ENCOUNTER — Telehealth: Payer: Self-pay | Admitting: Family Medicine

## 2019-03-24 NOTE — Telephone Encounter (Signed)
Spouse dropped of covid test results On cart to be delivered

## 2019-03-24 NOTE — Telephone Encounter (Signed)
Noted. Will send for scanning.

## 2019-04-01 ENCOUNTER — Other Ambulatory Visit: Payer: Self-pay | Admitting: Family Medicine

## 2019-04-03 ENCOUNTER — Telehealth: Payer: Self-pay | Admitting: *Deleted

## 2019-04-03 MED ORDER — AMOXICILLIN-POT CLAVULANATE 875-125 MG PO TABS: 1.0000 | ORAL_TABLET | Freq: Two times a day (BID) | ORAL | 0 refills | Status: AC

## 2019-04-03 NOTE — Telephone Encounter (Signed)
Patient's wife called stating that he did a visit over a week ago and was given an antibiotic. Mrs. Spickler stated that he is doing some better, but finished the antibiotic Thursday and feels that he needs a refill on it. Shirlean Mylar stated that patient over the weekend started having a lot of sinus pressure and feeling like he needs more antibiotic. Shirlean Mylar stated that he is not running a fever and not blowing much out of his nose. Shirlean Mylar stated that he was tested for covid and the results came back negative.  Pharmacy [facility]

## 2019-04-03 NOTE — Telephone Encounter (Signed)
Spoke with pt notifying him refill was sent to pharmacy.  Verbalizes understanding.

## 2019-04-03 NOTE — Telephone Encounter (Signed)
augmentin refilled

## 2019-08-08 ENCOUNTER — Other Ambulatory Visit: Payer: Self-pay | Admitting: Family Medicine

## 2019-08-08 NOTE — Telephone Encounter (Signed)
Plz schedule CPE and lab visits.  Then return encounter to me for refill.  

## 2019-08-09 NOTE — Telephone Encounter (Signed)
Noted.  E-scribed refill. 

## 2019-08-09 NOTE — Telephone Encounter (Signed)
Called patient and left message for patient to call back to get scheduled for cpe and labs.

## 2019-11-03 ENCOUNTER — Telehealth: Payer: Self-pay | Admitting: Family Medicine

## 2019-11-03 NOTE — Telephone Encounter (Signed)
E-scribed refill.  Plz schedule cpe and lab visits.  

## 2019-11-20 NOTE — Telephone Encounter (Signed)
Called patient and left voicemail for him to call office to get scheduled for cpe and labs.

## 2020-02-01 ENCOUNTER — Telehealth: Payer: Self-pay | Admitting: Family Medicine

## 2020-02-01 MED ORDER — LISINOPRIL-HYDROCHLOROTHIAZIDE 20-12.5 MG PO TABS: 1.0000 | ORAL_TABLET | Freq: Every day | ORAL | 0 refills | Status: DC

## 2020-02-01 NOTE — Telephone Encounter (Signed)
Robin spouse called checking on refill/  Wanting to know what meds was declined for refill   Best number [phone]

## 2020-02-01 NOTE — Telephone Encounter (Signed)
Pt will be out of meds in 1 week

## 2020-02-01 NOTE — Telephone Encounter (Addendum)
Spoke with pt's wife, Zella Ball (on dpr), explaining refill was denied because pt is overdue for follow up and cpe.    Irving Burton, will you add pt on 02/13/20 at 4:00 for CPE?  Then send TE back to me and I'll call pt to schedule lab visit and send refill.

## 2020-02-01 NOTE — Addendum Note (Signed)
Addended by: Nanci Pina on: 02/01/2020 04:53 PM   Modules accepted: Orders

## 2020-02-01 NOTE — Telephone Encounter (Signed)
This has been scheduled

## 2020-02-01 NOTE — Telephone Encounter (Signed)
Noted.  Spoke with pt's wife, Zella Ball, scheduling lab visit on 02/07/20 at 4:15.    E-scribed refill.  Zella Ball is aware and will inform pt.

## 2020-02-06 ENCOUNTER — Other Ambulatory Visit: Payer: Self-pay | Admitting: Family Medicine

## 2020-02-06 DIAGNOSIS — Z1159 Encounter for screening for other viral diseases: Secondary | ICD-10-CM

## 2020-02-06 DIAGNOSIS — Z125 Encounter for screening for malignant neoplasm of prostate: Secondary | ICD-10-CM

## 2020-02-06 DIAGNOSIS — R7303 Prediabetes: Secondary | ICD-10-CM

## 2020-02-06 DIAGNOSIS — E785 Hyperlipidemia, unspecified: Secondary | ICD-10-CM

## 2020-02-07 ENCOUNTER — Other Ambulatory Visit (INDEPENDENT_AMBULATORY_CARE_PROVIDER_SITE_OTHER): Payer: BC Managed Care – PPO

## 2020-02-07 ENCOUNTER — Other Ambulatory Visit: Payer: Self-pay

## 2020-02-07 DIAGNOSIS — Z125 Encounter for screening for malignant neoplasm of prostate: Secondary | ICD-10-CM | POA: Diagnosis not present

## 2020-02-07 DIAGNOSIS — Z1159 Encounter for screening for other viral diseases: Secondary | ICD-10-CM | POA: Diagnosis not present

## 2020-02-07 DIAGNOSIS — R7303 Prediabetes: Secondary | ICD-10-CM

## 2020-02-07 DIAGNOSIS — E785 Hyperlipidemia, unspecified: Secondary | ICD-10-CM

## 2020-02-08 LAB — LIPID PANEL
Cholesterol: 165 mg/dL (ref 0–200)
HDL: 43.3 mg/dL (ref >39.00)
LDL Cholesterol: 95 mg/dL (ref 0–99)
NonHDL: 121.54
Total CHOL/HDL Ratio: 4
Triglycerides: 131 mg/dL (ref 0.0–149.0)
VLDL: 26.2 mg/dL (ref 0.0–40.0)

## 2020-02-08 LAB — HEPATITIS C ANTIBODY
Hepatitis C Ab: NONREACTIVE
SIGNAL TO CUT-OFF: 0.01 (ref <1.00)

## 2020-02-08 LAB — PSA: PSA: 0.56 ng/mL (ref 0.10–4.00)

## 2020-02-08 LAB — HEMOGLOBIN A1C: Hgb A1c MFr Bld: 6.1 % (ref 4.6–6.5)

## 2020-02-09 LAB — COMPREHENSIVE METABOLIC PANEL
ALT: 18 U/L (ref 0–53)
AST: 23 U/L (ref 0–37)
Albumin: 4.5 g/dL (ref 3.5–5.2)
Alkaline Phosphatase: 51 U/L (ref 39–117)
BUN: 14 mg/dL (ref 6–23)
CO2: 30 mEq/L (ref 19–32)
Calcium: 9.1 mg/dL (ref 8.4–10.5)
Chloride: 98 mEq/L (ref 96–112)
Creatinine, Ser: 1.13 mg/dL (ref 0.40–1.50)
GFR: 75 mL/min (ref >60.00)
Glucose, Bld: 92 mg/dL (ref 70–99)
Potassium: 4.2 mEq/L (ref 3.5–5.1)
Sodium: 135 mEq/L (ref 135–145)
Total Bilirubin: 0.6 mg/dL (ref 0.2–1.2)
Total Protein: 6.4 g/dL (ref 6.0–8.3)

## 2020-02-13 ENCOUNTER — Other Ambulatory Visit: Payer: Self-pay

## 2020-02-13 ENCOUNTER — Ambulatory Visit (INDEPENDENT_AMBULATORY_CARE_PROVIDER_SITE_OTHER): Payer: BC Managed Care – PPO | Admitting: Family Medicine

## 2020-02-13 ENCOUNTER — Encounter: Payer: Self-pay | Admitting: Family Medicine

## 2020-02-13 VITALS — BP 154/90 | HR 68 | Temp 97.9°F | Ht 68.5 in | Wt 193.2 lb

## 2020-02-13 DIAGNOSIS — E785 Hyperlipidemia, unspecified: Secondary | ICD-10-CM

## 2020-02-13 DIAGNOSIS — Z0000 Encounter for general adult medical examination without abnormal findings: Secondary | ICD-10-CM | POA: Diagnosis not present

## 2020-02-13 DIAGNOSIS — I10 Essential (primary) hypertension: Secondary | ICD-10-CM

## 2020-02-13 DIAGNOSIS — R7303 Prediabetes: Secondary | ICD-10-CM

## 2020-02-13 DIAGNOSIS — Z1211 Encounter for screening for malignant neoplasm of colon: Secondary | ICD-10-CM | POA: Diagnosis not present

## 2020-02-13 MED ORDER — LISINOPRIL-HYDROCHLOROTHIAZIDE 20-25 MG PO TABS: 1.0000 | ORAL_TABLET | Freq: Every day | ORAL | 3 refills | Status: DC

## 2020-02-13 MED ORDER — FLUTICASONE PROPIONATE 50 MCG/ACT NA SUSP: NASAL | 3 refills | Status: AC

## 2020-02-13 MED ORDER — VITAMIN D3 25 MCG (1000 UT) PO CAPS: 1.0000 | ORAL_CAPSULE | Freq: Every day | ORAL | Status: DC

## 2020-02-13 NOTE — Assessment & Plan Note (Addendum)
Chronic, stable period off statin.  The 10-year ASCVD risk score [name] DC Montez Hageman., et al., 2013) is: 10.8%   Values used to calculate the score:     Age: 59 years     Sex: Male     Is Non-Hispanic African American: No     Diabetic: No     Tobacco smoker: No     Systolic Blood Pressure: 154 mmHg     Is BP treated: Yes     HDL Cholesterol: 43.3 mg/dL     Total Cholesterol: 165 mg/dL

## 2020-02-13 NOTE — Assessment & Plan Note (Signed)
Preventative protocols reviewed and updated unless pt declined. Discussed healthy diet and lifestyle.  

## 2020-02-13 NOTE — Assessment & Plan Note (Signed)
Continue limiting added sugars/ sweetened beverages in diet.

## 2020-02-13 NOTE — Progress Notes (Signed)
This visit was conducted in person.  BP (!) 154/90 (BP Location: Right Arm, Patient Position: Sitting, Cuff Size: Large)   Pulse 68   Temp 97.9 F (36.6 C) (Temporal)   Ht 5' 8.5" (1.74 m)   Wt 193 lb 4 oz (87.7 kg)   SpO2 96%   BMI 28.96 kg/m   BP Readings from Last 3 Encounters:  02/13/20 (!) 154/90  03/02/18 (!) 146/100  04/07/17 (!) 156/94   154/94 on recheck  CC: CPE Subjective:    Patient ID: Keith Chiquito., male    DOB: June 05, 1960, 59 y.o.   MRN: [id number]  HPI: Keith Sleight. is a 59 y.o. male presenting on 02/13/2020 for Annual Exam   Home BP readings run 130-140/80s.   Preventative: Colon cancer screening - discussed. Would like stool kit Prostate cancer screening - no fmhx. No nocturia, strong stream. Continue PSA.  Flu shot declines  Tetanus shot - 02/12/2012  COVID vaccine - discussed, declines  shingrix - discussed, declines Seat belt use discussed  Sunscreen use discussed. No changing moles  Non smoker Alcohol - 3 beers/wk Dentist - due Eye exam - due  "[name]" Caffeine: mountain dews 1/day, 2 cups tea  Lives with wife, 1 daughter (1998), 1 dog  Occupation: Furniture conservator/restorer  Edu: community college  Activity: walking  Diet: good water daily, some fruits/vegetables      Relevant past medical, surgical, family and social history reviewed and updated as indicated. Interim medical history since our last visit reviewed. Allergies and medications reviewed and updated. Outpatient Medications Prior to Visit  Medication Sig Dispense Refill  . ibuprofen (ADVIL,MOTRIN) 200 MG tablet Take 200 mg by mouth every 6 (six) hours as needed.    . TURMERIC PO Take 1 tablet by mouth daily as needed.    . vitamin C (ASCORBIC ACID) 500 MG tablet Take 500 mg by mouth daily.    . fluticasone (FLONASE) 50 MCG/ACT nasal spray SPRAY 2 SPRAYS INTO EACH NOSTRIL EVERY DAY 48 mL 1  . lisinopril-hydrochlorothiazide (ZESTORETIC) 20-12.5 MG tablet Take 1 tablet by  mouth daily. 90 tablet 0   No facility-administered medications prior to visit.     Per HPI unless specifically indicated in ROS section below Review of Systems  Constitutional: Negative for activity change, appetite change, chills, fatigue, fever and unexpected weight change.  HENT: Negative for hearing loss.   Eyes: Negative for visual disturbance.  Respiratory: Negative for cough, chest tightness, shortness of breath and wheezing.   Cardiovascular: Negative for chest pain, palpitations and leg swelling.  Gastrointestinal: Negative for abdominal distention, abdominal pain, blood in stool, constipation, diarrhea, nausea and vomiting.  Genitourinary: Negative for difficulty urinating and hematuria.  Musculoskeletal: Negative for arthralgias, myalgias and neck pain.  Skin: Negative for rash.  Neurological: Negative for dizziness, seizures, syncope and headaches.  Hematological: Negative for adenopathy. Does not bruise/bleed easily.  Psychiatric/Behavioral: Negative for dysphoric mood. The patient is not nervous/anxious.    Objective:  BP (!) 154/90 (BP Location: Right Arm, Patient Position: Sitting, Cuff Size: Large)   Pulse 68   Temp 97.9 F (36.6 C) (Temporal)   Ht 5' 8.5" (1.74 m)   Wt 193 lb 4 oz (87.7 kg)   SpO2 96%   BMI 28.96 kg/m   Wt Readings from Last 3 Encounters:  02/13/20 193 lb 4 oz (87.7 kg)  03/13/19 195 lb (88.5 kg)  03/02/18 192 lb 8 oz (87.3 kg)      Physical Exam Vitals  and nursing note reviewed.  Constitutional:      General: He is not in acute distress.    Appearance: Normal appearance. He is well-developed. He is not ill-appearing.  HENT:     Head: Normocephalic and atraumatic.     Right Ear: Hearing, tympanic membrane, ear canal and external ear normal.     Left Ear: Hearing, tympanic membrane, ear canal and external ear normal.  Eyes:     General: No scleral icterus.    Extraocular Movements: Extraocular movements intact.     Conjunctiva/sclera:  Conjunctivae normal.     Pupils: Pupils are equal, round, and reactive to light.  Neck:     Thyroid: No thyroid mass or thyromegaly.  Cardiovascular:     Rate and Rhythm: Normal rate and regular rhythm.     Pulses: Normal pulses.          Radial pulses are 2+ on the right side and 2+ on the left side.     Heart sounds: Normal heart sounds. No murmur heard.   Pulmonary:     Effort: Pulmonary effort is normal. No respiratory distress.     Breath sounds: Normal breath sounds. No wheezing, rhonchi or rales.  Abdominal:     General: Abdomen is flat. Bowel sounds are normal. There is no distension.     Palpations: Abdomen is soft. There is no mass.     Tenderness: There is no abdominal tenderness. There is no guarding or rebound.     Hernia: No hernia is present.  Musculoskeletal:        General: Normal range of motion.     Cervical back: Normal range of motion and neck supple.     Right lower leg: No edema.     Left lower leg: No edema.  Lymphadenopathy:     Cervical: No cervical adenopathy.  Skin:    General: Skin is warm and dry.     Findings: No rash.  Neurological:     General: No focal deficit present.     Mental Status: He is alert and oriented to person, place, and time.     Comments: CN grossly intact, station and gait intact  Psychiatric:        Mood and Affect: Mood normal.        Behavior: Behavior normal.        Thought Content: Thought content normal.        Judgment: Judgment normal.       Results for orders placed or performed in visit on 02/07/20  Hepatitis C antibody  Result Value Ref Range   Hepatitis C Ab NON-REACTIVE NON-REACTI   SIGNAL TO CUT-OFF 0.01 <1.00  PSA  Result Value Ref Range   PSA 0.56 0.10 - 4.00 ng/mL  Hemoglobin A1c  Result Value Ref Range   Hgb A1c MFr Bld 6.1 4.6 - 6.5 %  Comprehensive metabolic panel  Result Value Ref Range   Sodium 135 135 - 145 mEq/L   Potassium 4.2 3.5 - 5.1 mEq/L   Chloride 98 96 - 112 mEq/L   CO2 30 19 - 32  mEq/L   Glucose, Bld 92 70 - 99 mg/dL   BUN 14 6 - 23 mg/dL   Creatinine, Ser 1.13 0.40 - 1.50 mg/dL   Total Bilirubin 0.6 0.2 - 1.2 mg/dL   Alkaline Phosphatase 51 39 - 117 U/L   AST 23 0 - 37 U/L   ALT 18 0 - 53 U/L   Total Protein 6.4  6.0 - 8.3 g/dL   Albumin 4.5 3.5 - 5.2 g/dL   GFR 75.00 >60.00 mL/min   Calcium 9.1 8.4 - 10.5 mg/dL  Lipid panel  Result Value Ref Range   Cholesterol 165 0 - 200 mg/dL   Triglycerides 131.0 0 - 149 mg/dL   HDL 43.30 >39.00 mg/dL   VLDL 26.2 0.0 - 40.0 mg/dL   LDL Cholesterol 95 0 - 99 mg/dL   Total CHOL/HDL Ratio 4    NonHDL 121.54    Assessment & Plan:  This visit occurred during the SARS-CoV-2 public health emergency.  Safety protocols were in place, including screening questions prior to the visit, additional usage of staff PPE, and extensive cleaning of exam room while observing appropriate contact time as indicated for disinfecting solutions.   Problem List Items Addressed This Visit    Prediabetes    Continue limiting added sugars/ sweetened beverages in diet.       HTN (hypertension)    Chronic, deteriorated. Increase hctz component to 64m, continue lisinopril 247mdose. Update if persistently elevated readings at home. He already avoids salt/sodium in diet.       Relevant Medications   lisinopril-hydrochlorothiazide (ZESTORETIC) 20-25 MG tablet   HLD (hyperlipidemia)    Chronic, stable period off statin.  The 10-year ASCVD risk score (GMikey BussingC JrBrooke Bonito et al., 2013) is: 10.8%   Values used to calculate the score:     Age: 89ears     Sex: Male     Is Non-Hispanic African American: No     Diabetic: No     Tobacco smoker: No     Systolic Blood Pressure: 15712mHg     Is BP treated: Yes     HDL Cholesterol: 43.3 mg/dL     Total Cholesterol: 165 mg/dL       Relevant Medications   lisinopril-hydrochlorothiazide (ZESTORETIC) 20-25 MG tablet   Healthcare maintenance - Primary    Preventative protocols reviewed and updated unless  pt declined. Discussed healthy diet and lifestyle.        Other Visit Diagnoses    Special screening for malignant neoplasms, colon       Relevant Orders   Fecal occult blood, imunochemical       Meds ordered this encounter  Medications  . fluticasone (FLONASE) 50 MCG/ACT nasal spray    Sig: SPRAY 2 SPRAYS INTO EACH NOSTRIL EVERY DAY    Dispense:  48 mL    Refill:  3  . Cholecalciferol (VITAMIN D3) 25 MCG (1000 UT) CAPS    Sig: Take 1 capsule (1,000 Units total) by mouth daily.    Dispense:  30 capsule  . lisinopril-hydrochlorothiazide (ZESTORETIC) 20-25 MG tablet    Sig: Take 1 tablet by mouth daily.    Dispense:  90 tablet    Refill:  3   Orders Placed This Encounter  Procedures  . Fecal occult blood, imunochemical    Standing Status:   Future    Standing Expiration Date:   02/12/2021    Patient instructions: Pass by lab to pick up stool kit  Consider shingles vaccine - let usKoreanow if interested Consider COVID vaccine.  Blood pressures are staying elevated - increase lisinopril hctz to 20/2545m tablet daily - new dose at pharmacy. Keep an eye on blood pressures, goal is ideally 120/70-80s. Let me know if staying too high. Continue low salt low sodium diet to keep good blood pressure control.  Return as needed or in 1 year for  next physical.   Follow up plan: Return in about 1 year (around 02/12/2021) for annual exam, prior fasting for blood work.  Ria Bush, MD

## 2020-02-13 NOTE — Assessment & Plan Note (Signed)
Chronic, deteriorated. Increase hctz component to 25mg , continue lisinopril 20mg  dose. Update if persistently elevated readings at home. He already avoids salt/sodium in diet.

## 2020-02-13 NOTE — Patient Instructions (Addendum)
Pass by lab to pick up stool kit  Consider shingles vaccine - let us know if interested Consider COVID vaccine.  Blood pressures are staying elevated - increase lisinopril hctz to 20/41m 1 tablet daily - new dose at pharmacy. Keep an eye on blood pressures, goal is ideally 120/70-80s. Let me know if staying too high. Continue low salt low sodium diet to keep good blood pressure control.  Return as needed or in 1 year for next physical.   Health Maintenance, Male Adopting a healthy lifestyle and getting preventive care are important in promoting health and wellness. Ask your health care provider about:  The right schedule for you to have regular tests and exams.  Things you can do on your own to prevent diseases and keep yourself healthy. What should I know about diet, weight, and exercise? Eat a healthy diet   Eat a diet that includes plenty of vegetables, fruits, low-fat dairy products, and lean protein.  Do not eat a lot of foods that are high in solid fats, added sugars, or sodium. Maintain a healthy weight Body mass index (BMI) is a measurement that can be used to identify possible weight problems. It estimates body fat based on height and weight. Your health care provider can help determine your BMI and help you achieve or maintain a healthy weight. Get regular exercise Get regular exercise. This is one of the most important things you can do for your health. Most adults should:  Exercise for at least 150 minutes each week. The exercise should increase your heart rate and make you sweat (moderate-intensity exercise).  Do strengthening exercises at least twice a week. This is in addition to the moderate-intensity exercise.  Spend less time sitting. Even light physical activity can be beneficial. Watch cholesterol and blood lipids Have your blood tested for lipids and cholesterol at 58years of age, then have this test every 5 years. You may need to have your cholesterol levels  checked more often if:  Your lipid or cholesterol levels are high.  You are older than 58years of age.  You are at high risk for heart disease. What should I know about cancer screening? Many types of cancers can be detected early and may often be prevented. Depending on your health history and family history, you may need to have cancer screening at various ages. This may include screening for:  Colorectal cancer.  Prostate cancer.  Skin cancer.  Lung cancer. What should I know about heart disease, diabetes, and high blood pressure? Blood pressure and heart disease  High blood pressure causes heart disease and increases the risk of stroke. This is more likely to develop in people who have high blood pressure readings, are of African descent, or are overweight.  Talk with your health care provider about your target blood pressure readings.  Have your blood pressure checked: ? Every 3-5 years if you are [age]years of age. ? Every year if you are 448years old or older.  If you are between the ages of 691and 731and are a current or former smoker, ask your health care provider if you should have a one-time screening for abdominal aortic aneurysm (AAA). Diabetes Have regular diabetes screenings. This checks your fasting blood sugar level. Have the screening done:  Once every three years after age 8722if you are at a normal weight and have a low risk for diabetes.  More often and at a younger age if you are overweight or  have a high risk for diabetes. What should I know about preventing infection? Hepatitis B If you have a higher risk for hepatitis B, you should be screened for this virus. Talk with your health care provider to find out if you are at risk for hepatitis B infection. Hepatitis C Blood testing is recommended for:  Everyone born from [birth year] through 1964.  Anyone with known risk factors for hepatitis C. Sexually transmitted infections (STIs)  You should be screened  each year for STIs, including gonorrhea and chlamydia, if: ? You are sexually active and are younger than 58 years of age. ? You are older than 58 years of age and your health care provider tells you that you are at risk for this type of infection. ? Your sexual activity has changed since you were last screened, and you are at increased risk for chlamydia or gonorrhea. Ask your health care provider if you are at risk.  Ask your health care provider about whether you are at high risk for HIV. Your health care provider may recommend a prescription medicine to help prevent HIV infection. If you choose to take medicine to prevent HIV, you should first get tested for HIV. You should then be tested every 3 months for as long as you are taking the medicine. Follow these instructions at home: Lifestyle  Do not use any products that contain nicotine or tobacco, such as cigarettes, e-cigarettes, and chewing tobacco. If you need help quitting, ask your health care provider.  Do not use street drugs.  Do not share needles.  Ask your health care provider for help if you need support or information about quitting drugs. Alcohol use  Do not drink alcohol if your health care provider tells you not to drink.  If you drink alcohol: ? Limit how much you have to 0-2 drinks a day. ? Be aware of how much alcohol is in your drink. In the [place] one drink equals one 12 oz bottle of beer (355 mL), one 5 oz glass of wine (148 mL), or one 1 oz glass of hard liquor (44 mL). General instructions  Schedule regular health, dental, and eye exams.  Stay current with your vaccines.  Tell your health care provider if: ? You often feel depressed. ? You have ever been abused or do not feel safe at home. Summary  Adopting a healthy lifestyle and getting preventive care are important in promoting health and wellness.  Follow your health care provider's instructions about healthy diet, exercising, and getting tested or  screened for diseases.  Follow your health care provider's instructions on monitoring your cholesterol and blood pressure. This information is not intended to replace advice given to you by your health care provider. Make sure you discuss any questions you have with your health care provider. Document Revised: 03/30/2018 Document Reviewed: 03/30/2018 Elsevier Patient Education  2020 [employer].

## 2020-02-29 ENCOUNTER — Other Ambulatory Visit (INDEPENDENT_AMBULATORY_CARE_PROVIDER_SITE_OTHER): Payer: BC Managed Care – PPO

## 2020-02-29 DIAGNOSIS — Z1211 Encounter for screening for malignant neoplasm of colon: Secondary | ICD-10-CM | POA: Diagnosis not present

## 2020-02-29 LAB — FECAL OCCULT BLOOD, GUAIAC: Fecal Occult Blood: NEGATIVE

## 2020-02-29 LAB — FECAL OCCULT BLOOD, IMMUNOCHEMICAL: Fecal Occult Bld: NEGATIVE

## 2020-03-01 ENCOUNTER — Encounter: Payer: Self-pay | Admitting: Family Medicine

## 2020-04-27 ENCOUNTER — Other Ambulatory Visit: Payer: Self-pay | Admitting: Family Medicine

## 2020-05-14 ENCOUNTER — Encounter: Payer: Self-pay | Admitting: Internal Medicine

## 2020-05-14 ENCOUNTER — Telehealth (INDEPENDENT_AMBULATORY_CARE_PROVIDER_SITE_OTHER): Payer: BC Managed Care – PPO | Admitting: Internal Medicine

## 2020-05-14 DIAGNOSIS — J329 Chronic sinusitis, unspecified: Secondary | ICD-10-CM | POA: Diagnosis not present

## 2020-05-14 DIAGNOSIS — B9789 Other viral agents as the cause of diseases classified elsewhere: Secondary | ICD-10-CM | POA: Diagnosis not present

## 2020-05-14 MED ORDER — PREDNISONE 10 MG PO TABS: ORAL_TABLET | ORAL | 0 refills | Status: DC

## 2020-05-14 MED ORDER — BENZONATATE 200 MG PO CAPS: 200.0000 mg | ORAL_CAPSULE | Freq: Three times a day (TID) | ORAL | 0 refills | Status: DC | PRN

## 2020-05-14 NOTE — Progress Notes (Signed)
Virtual Visit via Video Note  I connected with Keith Lawson. on 05/14/20 at 11:45 AM EST by a video enabled telemedicine application and verified that I am speaking with the correct person using two identifiers.  Location: Patient: Work Provider: Office  Persons participating in this video call: Keith Keith Lawson and Keith Lawson, Keith Lawson.   I discussed the limitations of evaluation and management by telemedicine and the availability of in person appointments. The patient expressed understanding and agreed to proceed.  History of Present Illness:  Patient reports facial pressure, nasal congestion and cough. He reports this started 2 to 3 days ago. The facial pressure is located in his cheeks. He is blowing clear mucus out of his nose. The cough is productive of brown mucus at times. He denies headache, runny nose, ear pain, sore throat, loss of taste/smell, shortness of breath or chest pain. He denies fever, chills or body aches. He has tried Tylenol, ibuprofen, Flonase and NyQuil with some relief of symptoms. He denies exposure to Covid but does report that he is not vaccinated. He reports he is prone to sinus infections and this feels the same.   Past Medical History:  Diagnosis Date  . Dyslipidemia   . History of asthma    as a child  . HTN (hypertension)   . Seasonal allergic rhinitis    nasal congestion    Current Outpatient Medications  Medication Sig Dispense Refill  . benzonatate (TESSALON) 200 MG capsule Take 1 capsule (200 mg total) by mouth 3 (three) times daily as needed for cough. 30 capsule 0  . Cholecalciferol (VITAMIN D3) 25 MCG (1000 UT) CAPS Take 1 capsule (1,000 Units total) by mouth daily. 30 capsule   . fluticasone (FLONASE) 50 MCG/ACT nasal spray SPRAY 2 SPRAYS INTO EACH NOSTRIL EVERY DAY 48 mL 3  . ibuprofen (ADVIL,MOTRIN) 200 MG tablet Take 200 mg by mouth every 6 (six) hours as needed.    Marland Kitchen lisinopril-hydrochlorothiazide (ZESTORETIC) 20-25 MG tablet Take  1 tablet by mouth daily. 90 tablet 3  . predniSONE (DELTASONE) 10 MG tablet Take 3 tabs on days 1-2, take 2 tabs on days 3-4, take 1 tab on days 5-6 12 tablet 0  . TURMERIC PO Take 1 tablet by mouth daily as needed.    . vitamin C (ASCORBIC ACID) 500 MG tablet Take 500 mg by mouth daily.     No current facility-administered medications for this visit.    Allergies  Allergen Reactions  . Atorvastatin Other (See Comments)    Myalgias, arthralgias  . Prednisone Itching    Family History  Problem Relation Age of Onset  . CAD Father 20       MI  . Hypertension Father   . Hypertension Paternal Grandmother   . Stroke Neg Hx   . Cancer Neg Hx   . Diabetes Neg Hx     Social History   Socioeconomic History  . Marital status: Married    Spouse name: Not on file  . Number of children: Not on file  . Years of education: Not on file  . Highest education level: Not on file  Occupational History  . Not on file  Tobacco Use  . Smoking status: Never Smoker  . Smokeless tobacco: Never Used  Substance and Sexual Activity  . Alcohol use: No  . Drug use: No  . Sexual activity: Not on file  Other Topics Concern  . Not on file  Social History Narrative   "[name]"  Caffeine: mountain dews 1/day, 2 cups tea   Lives with wife, 1 daughter (1998), 1 dog   Occupation: Chartered certified accountant   Edu: community college   Activity: walking some, occasional bike riding   Diet: good water daily, some fruits/vegetables   Social Determinants of Corporate investment banker Strain: Not on file  Food Insecurity: Not on file  Transportation Needs: Not on file  Physical Activity: Not on file  Stress: Not on file  Social Connections: Not on file  Intimate Partner Violence: Not on file     Constitutional: Denies fever, malaise, fatigue, headache or abrupt weight changes.  HEENT: Patient reports facial pressure nasal congestion. Denies eye pain, eye redness, ear pain, ringing in the ears, wax buildup, runny  nose, bloody nose, or sore throat. Respiratory: Patient reports cough. Denies difficulty breathing, shortness of breath.   Cardiovascular: Denies chest pain, chest tightness, palpitations or swelling in the hands or feet.    No other specific complaints in a complete review of systems (except as listed in HPI above).  Observations/Objective:   Wt Readings from Last 3 Encounters:  02/13/20 193 lb 4 oz (87.7 kg)  03/13/19 195 lb (88.5 kg)  03/02/18 192 lb 8 oz (87.3 kg)    General: Appears his stated age, well developed, well nourished in NAD. HEENT: Head: normal shape and size;  Nose: Slight congestion noted.; Throat/Mouth: No hoarseness noted. Pulmonary/Chest: Normal effort. No respiratory distress.  Neurological: Alert and oriented.   BMET    Component Value Date/Time   NA 135 02/07/2020 1624   K 4.2 02/07/2020 1624   CL 98 02/07/2020 1624   CO2 30 02/07/2020 1624   GLUCOSE 92 02/07/2020 1624   BUN 14 02/07/2020 1624   CREATININE 1.13 02/07/2020 1624   CALCIUM 9.1 02/07/2020 1624    Lipid Panel     Component Value Date/Time   CHOL 165 02/07/2020 1624   TRIG 131.0 02/07/2020 1624   HDL 43.30 02/07/2020 1624   CHOLHDL 4 02/07/2020 1624   VLDL 26.2 02/07/2020 1624   LDLCALC 95 02/07/2020 1624    CBC No results found for: WBC, RBC, HGB, HCT, PLT, MCV, MCH, MCHC, RDW, LYMPHSABS, MONOABS, EOSABS, BASOSABS  Hgb A1C Lab Results  Component Value Date   HGBA1C 6.1 02/07/2020       Assessment and Plan:  Viral Sinusitis:  Given duration of symptoms. No indication for antibiotics at this time Rx for Pred taper x6 days, reviewed allergy which is mild itching, we will monitor this Rx for Tessalon 200 mg 3 times daily as needed If no improvement by Friday could consider oral antibiotics  Return precautions discussed Follow Up Instructions:    I discussed the assessment and treatment plan with the patient. The patient was provided an opportunity to ask questions  and all were answered. The patient agreed with the plan and demonstrated an understanding of the instructions.   The patient was advised to call back or seek an in-person evaluation if the symptoms worsen or if the condition fails to improve as anticipated.    Keith Keith Lawson

## 2020-05-14 NOTE — Patient Instructions (Signed)

## 2021-01-25 ENCOUNTER — Other Ambulatory Visit: Payer: Self-pay | Admitting: Family Medicine

## 2021-01-28 NOTE — Telephone Encounter (Signed)
Lvm for pt to call & schedule a cpe/lab

## 2021-01-28 NOTE — Telephone Encounter (Signed)
Please call patient and schedule annual physical. 

## 2021-01-29 NOTE — Telephone Encounter (Signed)
2nd attempt  LMTCB to schedule pt

## 2021-04-25 ENCOUNTER — Other Ambulatory Visit: Payer: Self-pay | Admitting: Family Medicine

## 2021-04-28 NOTE — Telephone Encounter (Signed)
Please call patient and schedule an appointment. Send back to CMA after appointment when appointment has been scheduled.

## 2021-04-30 NOTE — Telephone Encounter (Signed)
Keith Lawson called and scheduled apt for 2/24 @330 

## 2021-05-18 DIAGNOSIS — J069 Acute upper respiratory infection, unspecified: Secondary | ICD-10-CM | POA: Diagnosis not present

## 2021-06-13 ENCOUNTER — Other Ambulatory Visit: Payer: Self-pay

## 2021-06-13 ENCOUNTER — Ambulatory Visit: Payer: BC Managed Care – PPO | Admitting: Family Medicine

## 2021-06-13 ENCOUNTER — Encounter: Payer: Self-pay | Admitting: Family Medicine

## 2021-06-13 VITALS — BP 118/68 | HR 73 | Temp 98.0°F | Ht 68.5 in | Wt 189.2 lb

## 2021-06-13 DIAGNOSIS — I10 Essential (primary) hypertension: Secondary | ICD-10-CM

## 2021-06-13 LAB — BASIC METABOLIC PANEL
BUN: 15 mg/dL (ref 7–25)
CO2: 29 mmol/L (ref 20–32)
Calcium: 10 mg/dL (ref 8.6–10.3)
Chloride: 103 mmol/L (ref 98–110)
Creat: 1.18 mg/dL (ref 0.70–1.30)
Glucose, Bld: 90 mg/dL (ref 65–99)
Potassium: 4.5 mmol/L (ref 3.5–5.3)
Sodium: 139 mmol/L (ref 135–146)

## 2021-06-13 MED ORDER — LISINOPRIL-HYDROCHLOROTHIAZIDE 20-25 MG PO TABS: 1.0000 | ORAL_TABLET | Freq: Every day | ORAL | 3 refills | Status: DC

## 2021-06-13 NOTE — Progress Notes (Signed)
Patient ID: Keith Lawson., male    DOB: 07-10-1960, 61 y.o.   MRN: [id number]  This visit was conducted in person.  BP 118/68    Pulse 73    Temp 98 F (36.7 C) (Temporal)    Ht 5' 8.5" (1.74 m)    Wt 189 lb 4 oz (85.8 kg)    SpO2 96%    BMI 28.36 kg/m    CC: med refill visit  Subjective:   HPI: Nikoloz Huy. is a 61 y.o. male presenting on 06/13/2021 for Hypertension (Here for f/u and refill.)   Last seen 01/2020.  HTN - Compliant with current antihypertensive regimen of zestoretic 20/25mg  daily. Does check blood pressures at home once weekly - 117-120/70s.  No low blood pressure readings or symptoms of dizziness/syncope.  Denies HA, vision changes, CP/tightness, SOB, leg swelling.      Relevant past medical, surgical, family and social history reviewed and updated as indicated. Interim medical history since our last visit reviewed. Allergies and medications reviewed and updated. Outpatient Medications Prior to Visit  Medication Sig Dispense Refill   fluticasone (FLONASE) 50 MCG/ACT nasal spray SPRAY 2 SPRAYS INTO EACH NOSTRIL EVERY DAY 48 mL 3   ibuprofen (ADVIL,MOTRIN) 200 MG tablet Take 200 mg by mouth every 6 (six) hours as needed.     Multiple Vitamins-Minerals (EMERGEN-C IMMUNE PLUS) PACK Take by mouth daily.     lisinopril-hydrochlorothiazide (ZESTORETIC) 20-25 MG tablet TAKE 1 TABLET BY MOUTH EVERY DAY 90 tablet 0   benzonatate (TESSALON) 200 MG capsule Take 1 capsule (200 mg total) by mouth 3 (three) times daily as needed for cough. 30 capsule 0   Cholecalciferol (VITAMIN D3) 25 MCG (1000 UT) CAPS Take 1 capsule (1,000 Units total) by mouth daily. 30 capsule    predniSONE (DELTASONE) 10 MG tablet Take 3 tabs on days 1-2, take 2 tabs on days 3-4, take 1 tab on days 5-6 12 tablet 0   TURMERIC PO Take 1 tablet by mouth daily as needed.     vitamin C (ASCORBIC ACID) 500 MG tablet Take 500 mg by mouth daily.     No facility-administered medications prior to  visit.     Per HPI unless specifically indicated in ROS section below Review of Systems  Objective:  BP 118/68    Pulse 73    Temp 98 F (36.7 C) (Temporal)    Ht 5' 8.5" (1.74 m)    Wt 189 lb 4 oz (85.8 kg)    SpO2 96%    BMI 28.36 kg/m   Wt Readings from Last 3 Encounters:  06/13/21 189 lb 4 oz (85.8 kg)  02/13/20 193 lb 4 oz (87.7 kg)  03/13/19 195 lb (88.5 kg)      Physical Exam Vitals and nursing note reviewed.  Constitutional:      Appearance: Normal appearance. He is not ill-appearing.  Eyes:     Extraocular Movements: Extraocular movements intact.     Pupils: Pupils are equal, round, and reactive to light.  Neck:     Thyroid: No thyroid mass or thyromegaly.  Cardiovascular:     Rate and Rhythm: Normal rate and regular rhythm.     Pulses: Normal pulses.     Heart sounds: Normal heart sounds. No murmur heard. Pulmonary:     Effort: Pulmonary effort is normal. No respiratory distress.     Breath sounds: Normal breath sounds. No wheezing, rhonchi or rales.  Musculoskeletal:  Cervical back: Normal range of motion and neck supple. No rigidity.     Right lower leg: No edema.     Left lower leg: No edema.  Lymphadenopathy:     Cervical: No cervical adenopathy.  Skin:    General: Skin is warm and dry.     Findings: No rash.  Neurological:     Mental Status: He is alert.  Psychiatric:        Mood and Affect: Mood normal.        Behavior: Behavior normal.      Results for orders placed or performed in visit on 03/01/20  Fecal Occult Blood, Guaiac  Result Value Ref Range   Fecal Occult Blood Negative     Assessment & Plan:  This visit occurred during the SARS-CoV-2 public health emergency.  Safety protocols were in place, including screening questions prior to the visit, additional usage of staff PPE, and extensive cleaning of exam room while observing appropriate contact time as indicated for disinfecting solutions.   Problem List Items Addressed This Visit      HTN (hypertension) - Primary    Chronic, stable on current regimen - continue this. Pt tolerating medication well. Update BMP.       Relevant Medications   lisinopril-hydrochlorothiazide (ZESTORETIC) 20-25 MG tablet   Other Relevant Orders   Basic metabolic panel     Meds ordered this encounter  Medications   lisinopril-hydrochlorothiazide (ZESTORETIC) 20-25 MG tablet    Sig: Take 1 tablet by mouth daily.    Dispense:  90 tablet    Refill:  3   Orders Placed This Encounter  Procedures   Basic metabolic panel      Patient Instructions  One blood test today.  Good to see you today  Return as needed or in 6 months for physical.  Continue blood pressure medicine.   Follow up plan: Return in about 6 months (around 12/11/2021) for annual exam, prior fasting for blood work.  Eustaquio Boyden, MD

## 2021-06-13 NOTE — Patient Instructions (Addendum)
One blood test today.  Good to see you today  Return as needed or in 6 months for physical.  Continue blood pressure medicine.

## 2021-06-13 NOTE — Assessment & Plan Note (Signed)
Chronic, stable on current regimen - continue this. Pt tolerating medication well. Update BMP.

## 2021-09-28 DIAGNOSIS — J019 Acute sinusitis, unspecified: Secondary | ICD-10-CM | POA: Diagnosis not present

## 2022-06-15 DIAGNOSIS — J0100 Acute maxillary sinusitis, unspecified: Secondary | ICD-10-CM | POA: Diagnosis not present

## 2022-07-15 ENCOUNTER — Other Ambulatory Visit: Payer: Self-pay | Admitting: Family Medicine

## 2022-07-15 DIAGNOSIS — I10 Essential (primary) hypertension: Secondary | ICD-10-CM

## 2022-07-15 NOTE — Telephone Encounter (Signed)
Noted  

## 2022-07-15 NOTE — Telephone Encounter (Signed)
Called and LVM for patient TCB and sched, sent mychart message.

## 2022-07-15 NOTE — Telephone Encounter (Signed)
E-scribed refill.  Plz schedule CPE and fasting lab visits for additional refills.

## 2022-10-04 ENCOUNTER — Other Ambulatory Visit: Payer: Self-pay | Admitting: Family Medicine

## 2022-10-04 DIAGNOSIS — E785 Hyperlipidemia, unspecified: Secondary | ICD-10-CM

## 2022-10-04 DIAGNOSIS — R7303 Prediabetes: Secondary | ICD-10-CM

## 2022-10-04 DIAGNOSIS — Z125 Encounter for screening for malignant neoplasm of prostate: Secondary | ICD-10-CM

## 2022-10-06 ENCOUNTER — Other Ambulatory Visit: Payer: Self-pay

## 2022-10-13 ENCOUNTER — Encounter: Payer: Self-pay | Admitting: Family Medicine

## 2022-10-20 ENCOUNTER — Other Ambulatory Visit: Payer: Self-pay | Admitting: Family Medicine

## 2022-10-20 DIAGNOSIS — I10 Essential (primary) hypertension: Secondary | ICD-10-CM

## 2023-01-12 ENCOUNTER — Other Ambulatory Visit (INDEPENDENT_AMBULATORY_CARE_PROVIDER_SITE_OTHER): Payer: BC Managed Care – PPO

## 2023-01-12 DIAGNOSIS — Z125 Encounter for screening for malignant neoplasm of prostate: Secondary | ICD-10-CM

## 2023-01-12 DIAGNOSIS — E785 Hyperlipidemia, unspecified: Secondary | ICD-10-CM

## 2023-01-12 DIAGNOSIS — R7303 Prediabetes: Secondary | ICD-10-CM

## 2023-01-13 LAB — COMPREHENSIVE METABOLIC PANEL
ALT: 22 U/L (ref 0–53)
AST: 25 U/L (ref 0–37)
Albumin: 4.5 g/dL (ref 3.5–5.2)
Alkaline Phosphatase: 66 U/L (ref 39–117)
BUN: 15 mg/dL (ref 6–23)
CO2: 27 mEq/L (ref 19–32)
Calcium: 9.4 mg/dL (ref 8.4–10.5)
Chloride: 100 mEq/L (ref 96–112)
Creatinine, Ser: 0.99 mg/dL (ref 0.40–1.50)
GFR: 82.48 mL/min (ref >60.00)
Glucose, Bld: 100 mg/dL — ABNORMAL HIGH (ref 70–99)
Potassium: 3.5 mEq/L (ref 3.5–5.1)
Sodium: 137 mEq/L (ref 135–145)
Total Bilirubin: 0.6 mg/dL (ref 0.2–1.2)
Total Protein: 6.8 g/dL (ref 6.0–8.3)

## 2023-01-13 LAB — HEMOGLOBIN A1C: Hgb A1c MFr Bld: 5.9 % (ref 4.6–6.5)

## 2023-01-13 LAB — LIPID PANEL
Cholesterol: 161 mg/dL (ref 0–200)
HDL: 48.2 mg/dL (ref >39.00)
LDL Cholesterol: 81 mg/dL (ref 0–99)
NonHDL: 112.74
Total CHOL/HDL Ratio: 3
Triglycerides: 161 mg/dL — ABNORMAL HIGH (ref 0.0–149.0)
VLDL: 32.2 mg/dL (ref 0.0–40.0)

## 2023-01-13 LAB — PSA: PSA: 0.61 ng/mL (ref 0.10–4.00)

## 2023-01-17 ENCOUNTER — Other Ambulatory Visit: Payer: Self-pay | Admitting: Family Medicine

## 2023-01-17 DIAGNOSIS — I10 Essential (primary) hypertension: Secondary | ICD-10-CM

## 2023-01-19 ENCOUNTER — Ambulatory Visit (INDEPENDENT_AMBULATORY_CARE_PROVIDER_SITE_OTHER): Payer: BC Managed Care – PPO | Admitting: Family Medicine

## 2023-01-19 ENCOUNTER — Encounter: Payer: Self-pay | Admitting: Family Medicine

## 2023-01-19 VITALS — BP 134/80 | HR 70 | Temp 98.2°F | Ht 67.5 in | Wt 177.4 lb

## 2023-01-19 DIAGNOSIS — I10 Essential (primary) hypertension: Secondary | ICD-10-CM

## 2023-01-19 DIAGNOSIS — Z0000 Encounter for general adult medical examination without abnormal findings: Secondary | ICD-10-CM

## 2023-01-19 DIAGNOSIS — R7303 Prediabetes: Secondary | ICD-10-CM | POA: Diagnosis not present

## 2023-01-19 DIAGNOSIS — Z8249 Family history of ischemic heart disease and other diseases of the circulatory system: Secondary | ICD-10-CM

## 2023-01-19 DIAGNOSIS — Z9189 Other specified personal risk factors, not elsewhere classified: Secondary | ICD-10-CM

## 2023-01-19 DIAGNOSIS — Z1211 Encounter for screening for malignant neoplasm of colon: Secondary | ICD-10-CM | POA: Diagnosis not present

## 2023-01-19 DIAGNOSIS — Z136 Encounter for screening for cardiovascular disorders: Secondary | ICD-10-CM

## 2023-01-19 DIAGNOSIS — E785 Hyperlipidemia, unspecified: Secondary | ICD-10-CM

## 2023-01-19 MED ORDER — LISINOPRIL-HYDROCHLOROTHIAZIDE 20-25 MG PO TABS: 1.0000 | ORAL_TABLET | Freq: Every day | ORAL | 4 refills | Status: DC

## 2023-01-19 NOTE — Assessment & Plan Note (Signed)
Chronic, mildly elevated triglycerides. Reviewed diet choices to control this.  Will check coronary CT with calcium score given fmhx.  The 10-year ASCVD risk score (Arnett DK, et al., 2019) is: 10%   Values used to calculate the score:     Age: 61 years     Sex: Male     Is Non-Hispanic African American: No     Diabetic: No     Tobacco smoker: No     Systolic Blood Pressure: 134 mmHg     Is BP treated: Yes     HDL Cholesterol: 48.2 mg/dL     Total Cholesterol: 161 mg/dL

## 2023-01-19 NOTE — Progress Notes (Signed)
Ph: [phone] Fax: [phone]   Patient ID: Keith Lawson., male    DOB: 1961-02-27, 62 y.o.   MRN: [id number]  This visit was conducted in person.  BP 134/80   Pulse 70   Temp 98.2 F (36.8 C) (Oral)   Ht 5' 7.5" (1.715 m)   Wt 177 lb 6 oz (80.5 kg)   SpO2 98%   BMI 27.37 kg/m    CC: CPE Subjective:   HPI: Keith Meas. is a 62 y.o. male presenting on 01/19/2023 for Annual Exam   Preventative: Colon cancer screening - iFOB normal last 02/2020  Prostate cancer screening - no fmhx.  No nocturia, strong stream. Continue PSA.  Lung cancer scree - not eligible  Flu shot declines  COVID vaccine - declines  Tetanus shot - 02/12/2012 - declines rpt Shingrix - discussed, declines Seat belt use discussed  Sunscreen use discussed.  No changing moles.  Non smoker Alcohol - 3 beers/wk Dentist - recent dental work, yearly Eye exam - has not seen, no fmhx glaucoma  "[name]" Caffeine: mountain dews 1/day, 2 cups tea  Lives with wife, 1 daughter (1998), 1 dog  Occupation: Chartered certified accountant  Edu: community college  Activity: walking  Diet: good water daily, some fruits/vegetables     Relevant past medical, surgical, family and social history reviewed and updated as indicated. Interim medical history since our last visit reviewed. Allergies and medications reviewed and updated. Outpatient Medications Prior to Visit  Medication Sig Dispense Refill   fluticasone (FLONASE) 50 MCG/ACT nasal spray SPRAY 2 SPRAYS INTO EACH NOSTRIL EVERY DAY 48 mL 3   ibuprofen (ADVIL,MOTRIN) 200 MG tablet Take 200 mg by mouth every 6 (six) hours as needed.     Multiple Vitamins-Minerals (EMERGEN-C IMMUNE PLUS) PACK Take by mouth daily.     lisinopril-hydrochlorothiazide (ZESTORETIC) 20-25 MG tablet TAKE 1 TABLET BY MOUTH EVERY DAY 90 tablet 0   No facility-administered medications prior to visit.     Per HPI unless specifically indicated in ROS section below Review of Systems   Constitutional:  Negative for activity change, appetite change, chills, fatigue, fever and unexpected weight change.  HENT:  Negative for hearing loss.   Eyes:  Negative for visual disturbance.  Respiratory:  Negative for cough, chest tightness, shortness of breath and wheezing.   Cardiovascular:  Negative for chest pain, palpitations and leg swelling.  Gastrointestinal:  Negative for abdominal distention, abdominal pain, blood in stool, constipation, diarrhea, nausea and vomiting.  Genitourinary:  Negative for difficulty urinating and hematuria.  Musculoskeletal:  Negative for arthralgias, myalgias and neck pain.  Skin:  Negative for rash.  Neurological:  Negative for dizziness, seizures, syncope and headaches.  Hematological:  Negative for adenopathy. Does not bruise/bleed easily.  Psychiatric/Behavioral:  Negative for dysphoric mood. The patient is not nervous/anxious.     Objective:  BP 134/80   Pulse 70   Temp 98.2 F (36.8 C) (Oral)   Ht 5' 7.5" (1.715 m)   Wt 177 lb 6 oz (80.5 kg)   SpO2 98%   BMI 27.37 kg/m   Wt Readings from Last 3 Encounters:  01/19/23 177 lb 6 oz (80.5 kg)  06/13/21 189 lb 4 oz (85.8 kg)  02/13/20 193 lb 4 oz (87.7 kg)      Physical Exam Vitals and nursing note reviewed.  Constitutional:      General: He is not in acute distress.    Appearance: Normal appearance. He is well-developed. He is not  ill-appearing.  HENT:     Head: Normocephalic and atraumatic.     Right Ear: Hearing, tympanic membrane, ear canal and external ear normal.     Left Ear: Hearing, tympanic membrane, ear canal and external ear normal.     Mouth/Throat:     Mouth: Mucous membranes are moist.     Pharynx: Oropharynx is clear. No oropharyngeal exudate or posterior oropharyngeal erythema.  Eyes:     General: No scleral icterus.    Extraocular Movements: Extraocular movements intact.     Conjunctiva/sclera: Conjunctivae normal.     Pupils: Pupils are equal, round, and  reactive to light.  Neck:     Thyroid: No thyroid mass or thyromegaly.     Vascular: No carotid bruit.  Cardiovascular:     Rate and Rhythm: Normal rate and regular rhythm.     Pulses: Normal pulses.          Radial pulses are 2+ on the right side and 2+ on the left side.     Heart sounds: Normal heart sounds. No murmur heard. Pulmonary:     Effort: Pulmonary effort is normal. No respiratory distress.     Breath sounds: Normal breath sounds. No wheezing, rhonchi or rales.  Abdominal:     General: Bowel sounds are normal. There is no distension.     Palpations: Abdomen is soft. There is no mass.     Tenderness: There is no abdominal tenderness. There is no guarding or rebound.     Hernia: No hernia is present.  Musculoskeletal:        General: Normal range of motion.     Cervical back: Normal range of motion and neck supple.     Right lower leg: No edema.     Left lower leg: No edema.  Lymphadenopathy:     Cervical: No cervical adenopathy.  Skin:    General: Skin is warm and dry.     Findings: No rash.  Neurological:     General: No focal deficit present.     Mental Status: He is alert and oriented to person, place, and time.  Psychiatric:        Mood and Affect: Mood normal.        Behavior: Behavior normal.        Thought Content: Thought content normal.        Judgment: Judgment normal.       Results for orders placed or performed in visit on 01/12/23  PSA  Result Value Ref Range   PSA 0.61 0.10 - 4.00 ng/mL  Hemoglobin A1c  Result Value Ref Range   Hgb A1c MFr Bld 5.9 4.6 - 6.5 %  Comprehensive metabolic panel  Result Value Ref Range   Sodium 137 135 - 145 mEq/L   Potassium 3.5 3.5 - 5.1 mEq/L   Chloride 100 96 - 112 mEq/L   CO2 27 19 - 32 mEq/L   Glucose, Bld 100 (H) 70 - 99 mg/dL   BUN 15 6 - 23 mg/dL   Creatinine, Ser 0.86 0.40 - 1.50 mg/dL   Total Bilirubin 0.6 0.2 - 1.2 mg/dL   Alkaline Phosphatase 66 39 - 117 U/L   AST 25 0 - 37 U/L   ALT 22 0 - 53  U/L   Total Protein 6.8 6.0 - 8.3 g/dL   Albumin 4.5 3.5 - 5.2 g/dL   GFR 57.84 >69.62 mL/min   Calcium 9.4 8.4 - 10.5 mg/dL  Lipid panel  Result Value  Ref Range   Cholesterol 161 0 - 200 mg/dL   Triglycerides 782.9 (H) 0.0 - 149.0 mg/dL   HDL 56.21 >30.86 mg/dL   VLDL 57.8 0.0 - 46.9 mg/dL   LDL Cholesterol 81 0 - 99 mg/dL   Total CHOL/HDL Ratio 3    NonHDL 112.74     Assessment & Plan:   Problem List Items Addressed This Visit     Healthcare maintenance - Primary (Chronic)    Preventative protocols reviewed and updated unless pt declined. Discussed healthy diet and lifestyle.       HTN (hypertension)    Chronic, stable on current regimen - continue       Relevant Medications   lisinopril-hydrochlorothiazide (ZESTORETIC) 20-25 MG tablet   Prediabetes    Continue limiting added sugars/sweetened beverages.       HLD (hyperlipidemia)    Chronic, mildly elevated triglycerides. Reviewed diet choices to control this.  Will check coronary CT with calcium score given fmhx.  The 10-year ASCVD risk score (Arnett DK, et al., 2019) is: 10%   Values used to calculate the score:     Age: 17 years     Sex: Male     Is Non-Hispanic African American: No     Diabetic: No     Tobacco smoker: No     Systolic Blood Pressure: 134 mmHg     Is BP treated: Yes     HDL Cholesterol: 48.2 mg/dL     Total Cholesterol: 161 mg/dL      Relevant Medications   lisinopril-hydrochlorothiazide (ZESTORETIC) 20-25 MG tablet   Other Relevant Orders   CT CARDIAC SCORING (SELF PAY ONLY)   Other Visit Diagnoses     Special screening for malignant neoplasms, colon       Relevant Orders   Cologuard   Family history of coronary artery disease       Relevant Orders   CT CARDIAC SCORING (SELF PAY ONLY)   Encounter for screening for coronary artery disease in patient with risk for coronary artery disease between 10% and 20% in next 10 years       Relevant Orders   CT CARDIAC SCORING (SELF PAY ONLY)         Meds ordered this encounter  Medications   lisinopril-hydrochlorothiazide (ZESTORETIC) 20-25 MG tablet    Sig: Take 1 tablet by mouth daily.    Dispense:  90 tablet    Refill:  4    Orders Placed This Encounter  Procedures   CT CARDIAC SCORING (SELF PAY ONLY)    Standing Status:   Future    Standing Expiration Date:   01/19/2024    Order Specific Question:   Preferred imaging location?    Answer:   [facility]   Cologuard    Patient Instructions  We will sign you up for Cologuard - expect a kit in the mail.  Consider tetanus shot and shingles shot.  We will sign you up for coronary artery disease screening test with calcium score (CT of heart) in [place].  Good to see you today Return as needed or in 1 year for next physical  Follow up plan: Return in about 1 year (around 01/19/2024) for annual exam, prior fasting for blood work.  Eustaquio Boyden, MD

## 2023-01-19 NOTE — Assessment & Plan Note (Addendum)
Continue limiting added sugars/sweetened beverages.

## 2023-01-19 NOTE — Patient Instructions (Addendum)
We will sign you up for Cologuard - expect a kit in the mail.  Consider tetanus shot and shingles shot.  We will sign you up for coronary artery disease screening test with calcium score (CT of heart) in [place].  Good to see you today Return as needed or in 1 year for next physical

## 2023-01-19 NOTE — Assessment & Plan Note (Signed)
Preventative protocols reviewed and updated unless pt declined. Discussed healthy diet and lifestyle.  

## 2023-01-19 NOTE — Assessment & Plan Note (Signed)
Chronic, stable on current regimen - continue. 

## 2023-02-14 DIAGNOSIS — Z1211 Encounter for screening for malignant neoplasm of colon: Secondary | ICD-10-CM | POA: Diagnosis not present

## 2023-02-22 LAB — COLOGUARD: COLOGUARD: NEGATIVE

## 2024-01-05 ENCOUNTER — Ambulatory Visit (INDEPENDENT_AMBULATORY_CARE_PROVIDER_SITE_OTHER)
Admission: RE | Admit: 2024-01-05 | Discharge: 2024-01-05 | Disposition: A | Discharge status: Home / Self Care | Source: Ambulatory Visit | Attending: Nurse Practitioner | Admitting: Nurse Practitioner

## 2024-01-05 ENCOUNTER — Ambulatory Visit (INDEPENDENT_AMBULATORY_CARE_PROVIDER_SITE_OTHER): Admitting: Nurse Practitioner

## 2024-01-05 ENCOUNTER — Encounter: Payer: Self-pay | Admitting: Nurse Practitioner

## 2024-01-05 VITALS — BP 128/72 | HR 82 | Temp 98.6°F | Ht 67.5 in | Wt 186.5 lb

## 2024-01-05 DIAGNOSIS — M79674 Pain in right toe(s): Secondary | ICD-10-CM

## 2024-01-05 MED ORDER — PREDNISONE 20 MG PO TABS
40.0000 mg | ORAL_TABLET | Freq: Every day | ORAL | 0 refills | Status: DC
Start: 2024-01-05 — End: 2024-01-14

## 2024-01-05 NOTE — Progress Notes (Signed)
 Acute Office Visit  Subjective:     Patient ID: Keith Ekholm., male    DOB: Sep 11, 1960, 63 y.o.   MRN: [id number]  Chief Complaint  Patient presents with   Toe Pain    C/o R great toe pain/redness. Started 01/03/24.     Toe Pain    Patient is in today for toe pain with a history of HTN, prediabetes, HLD. He is on hydrochlorothiazide    Discussed the use of AI scribe software for clinical note transcription with the patient, who gave verbal consent to proceed.  History of Present Illness Keith Dobbins. is a 63 year old male with gout who presents with swelling and redness of the foot.  He began experiencing swelling and redness in his foot two days ago after a weekend at the beach where he was dancing extensively. The swelling started in his toes and has since spread, with redness also present. Pain occurs when he is on his feet, particularly under the ball of his foot and in the arch, but not when sitting still. No numbness or tingling is present, and he is able to wiggle his toes, although with some difficulty due to swelling.  He has a history of a similar episode in the past, which resolved after a few days with ice application. This time, he has tried Advil, warm water, and ice, with minimal relief. He denies any recent trauma, insect bites, or fever and chills.  He is a [employer] and notes that his work requires him to be on his feet a lot, which is challenging due to the swelling. He also mentions that he wore steel-toed boots, which may have contributed to the discomfort.  Socially, he is involved in a dance club and recently attended an event at [facility]. He reports consuming a few mixed drinks during the event but denies eating seafood or red meat.   Review of Systems  Constitutional:  Negative for chills and fever.  Respiratory:  Negative for shortness of breath.   Cardiovascular:  Negative for chest pain.  Musculoskeletal:  Positive  for joint pain.        Objective:    BP 128/72   Pulse 82   Temp 98.6 F (37 C) (Oral)   Ht 5' 7.5 (1.715 m)   Wt 186 lb 8 oz (84.6 kg)   SpO2 96%   BMI 28.78 kg/m    Physical Exam Vitals and nursing note reviewed.  Constitutional:      Appearance: Normal appearance.  Cardiovascular:     Rate and Rhythm: Normal rate and regular rhythm.     Pulses:          Dorsalis pedis pulses are 2+ on the right side.     Heart sounds: Normal heart sounds.  Pulmonary:     Effort: Pulmonary effort is normal.     Breath sounds: Normal breath sounds.  Musculoskeletal:        General: Tenderness present.       Feet:  Neurological:     Mental Status: He is alert.     No results found for any visits on 01/05/24.      Assessment & Plan:   Problem List Items Addressed This Visit   None Visit Diagnoses       Great toe pain, right    -  Primary   Relevant Medications   predniSONE  (DELTASONE ) 20 MG tablet   Other Relevant Orders   DG  Foot Complete Right   CBC   Uric acid      Assessment and Plan Assessment & Plan Acute right foot pain and swelling, likely gout Acute right foot pain and swelling, likely gout due to presentation and history, including alcohol consumption and hydrochlorothiazide  use. Differential includes cellulitis due to spreading redness. - Prescribed prednisone , advised taking with food, preferably in the morning. - Advised switching to Tylenol  for pain management instead of ibuprofen while on prednisone . - Instructed to elevate the leg and apply ice. - Ordered uric acid level. - Ordered complete blood count. - Ordered x-ray of the right foot. - Advised to monitor for spreading redness, fever, or chills and report immediately. - Discussed potential need for antibiotics if cellulitis is confirmed.  Essential hypertension Lisinopril  and hydrochlorothiazide  listed among current medications. Hydrochlorothiazide  may increase uric acid levels, raising gout  risk.  Meds ordered this encounter  Medications   predniSONE  (DELTASONE ) 20 MG tablet    Sig: Take 2 tablets (40 mg total) by mouth daily with breakfast.    Dispense:  10 tablet    Refill:  0    Supervising Provider:   RANDEEN HARDY A [[id number]]    Return if symptoms worsen or fail to improve.  Keith Crandall, NP

## 2024-01-05 NOTE — Patient Instructions (Signed)
 Nice to see you today  I will be in touch with the labs and xray once I have them  Follow up if you do not improve   Take the prednisone  with food. Tylenol  and ice as needed  Avoid NSAIDS like Ibuprofen, Motrin, Aleve, Naproxen, BC/Goody powders while on the prednisone 

## 2024-01-06 LAB — CBC
HCT: 39.8 % (ref 39.0–52.0)
Hemoglobin: 13.5 g/dL (ref 13.0–17.0)
MCHC: 33.9 g/dL (ref 30.0–36.0)
MCV: 91.7 fl (ref 78.0–100.0)
Platelets: 237 K/uL (ref 150.0–400.0)
RBC: 4.34 Mil/uL (ref 4.22–5.81)
RDW: 13.2 % (ref 11.5–15.5)
WBC: 11.4 K/uL — ABNORMAL HIGH (ref 4.0–10.5)

## 2024-01-06 LAB — URIC ACID: Uric Acid, Serum: 6.8 mg/dL (ref 4.0–7.8)

## 2024-01-07 ENCOUNTER — Ambulatory Visit: Payer: Self-pay | Admitting: Nurse Practitioner

## 2024-01-07 DIAGNOSIS — M7731 Calcaneal spur, right foot: Secondary | ICD-10-CM | POA: Diagnosis not present

## 2024-01-07 DIAGNOSIS — M7989 Other specified soft tissue disorders: Secondary | ICD-10-CM | POA: Diagnosis not present

## 2024-01-07 DIAGNOSIS — M79674 Pain in right toe(s): Secondary | ICD-10-CM | POA: Diagnosis not present

## 2024-01-07 DIAGNOSIS — M19071 Primary osteoarthritis, right ankle and foot: Secondary | ICD-10-CM | POA: Diagnosis not present

## 2024-01-14 ENCOUNTER — Other Ambulatory Visit: Payer: Self-pay | Admitting: Family Medicine

## 2024-01-14 ENCOUNTER — Ambulatory Visit: Payer: Self-pay

## 2024-01-14 MED ORDER — PREDNISONE 20 MG PO TABS: ORAL_TABLET | ORAL | 0 refills | Status: DC

## 2024-01-14 NOTE — Telephone Encounter (Signed)
 FYI Only or Action Required?: FYI only for provider.  Patient was last seen in primary care on 01/05/2024 by Wendee Lynwood HERO, NP.  Called Nurse Triage reporting Gout (pain).  Symptoms began several days ago.  Interventions attempted: OTC medications: Advil and Prescription medications: Prednisone .  Symptoms are: gradually worsening.  Triage Disposition: See PCP When Office is Open (Within 3 Days)  Patient/caregiver understands and will follow disposition?: Yes  Copied from CRM [id number]. Topic: Clinical - Red Word Triage >> Jan 14, 2024 12:14 PM [place] E wrote: [facility] that prompted transfer to Nurse Triage: Gout in right foot, sore on top, with redness in toes. Wife, Grayce, on the line.  Reason for Disposition  [1] MODERATE pain (e.g., interferes with normal activities, limping) AND [2] present > 3 days  Answer Assessment - Initial Assessment Questions Onset of pain in right foot and great toe past few weeks. Saw HCP on 9/17 and prescribed prednisone . Helped with pain and swelling until Sunday then stopped working for pain. Completed course of prednisone , taking advil now for pain. Advised to take tylenol  per note from MD on 9/17. Can walk around fine. Appt schedule Monday 9/29.  1. ONSET: When did the pain start?      Pain flared up Sunday 9/28. Was previously responding to prednisone  prescribed on 9/17 by provider for a few days, no longer working. 2. LOCATION: Where is the pain located?      Big toe, top of foot 3. PAIN: How bad is the pain?    (Scale 1-10; or mild, moderate, severe)     6/10 4. WORK OR EXERCISE: Has there been any recent work or exercise that involved this part of the body?      No 5. CAUSE: What do you think is causing the foot pain?     Gout 6. OTHER SYMPTOMS: Do you have any other symptoms? (e.g., leg pain, rash, fever, numbness)     Was having foot swelling before the prednisone , has gone down but pain persists. No fever.  Protocols used:  Foot Pain-A-AH

## 2024-01-14 NOTE — Addendum Note (Signed)
 Addended by: RILLA BALLER on: 01/14/2024 05:56 PM   Modules accepted: Orders

## 2024-01-14 NOTE — Telephone Encounter (Addendum)
 Chart reviewed. Spoke with patient.  5d prednisone  burst was helpful - then symptoms recurred.  Overall improved redness, tenderness, able to walking a little better.  He is on his feet all day at work - feels this has exacerbated.  No fever, streaking redness, overall improved.  He has been taking ibuprofen 400mg  once daily.  Rec increase to 600mg  TID with meals.  Also rec start tart cherry juice /vit C.   If this doesn't help, will ERx WASP for prednisone  taper with instructions on when to take.   He states he's tolerated prednisone  well in the past - will remove prednisone  from allergies.

## 2024-01-14 NOTE — Progress Notes (Signed)
 ERx prednisone  taper

## 2024-01-17 ENCOUNTER — Ambulatory Visit: Admitting: Family Medicine

## 2024-01-19 NOTE — Telephone Encounter (Signed)
 See phone note - addressed last week.

## 2024-02-10 MED ORDER — COLCHICINE 0.6 MG PO TABS: 0.6000 mg | ORAL_TABLET | Freq: Every day | ORAL | 0 refills | Status: DC | PRN

## 2024-02-10 MED ORDER — PREDNISONE 20 MG PO TABS: ORAL_TABLET | ORAL | 0 refills | Status: DC

## 2024-02-10 NOTE — Addendum Note (Signed)
 Addended by: RILLA BALLER on: 02/10/2024 01:13 PM   Modules accepted: Orders

## 2024-03-03 ENCOUNTER — Other Ambulatory Visit: Payer: Self-pay | Admitting: Family Medicine

## 2024-03-06 NOTE — Telephone Encounter (Signed)
 ERx

## 2024-04-04 ENCOUNTER — Other Ambulatory Visit: Payer: Self-pay | Admitting: Family Medicine

## 2024-04-04 DIAGNOSIS — I10 Essential (primary) hypertension: Secondary | ICD-10-CM

## 2024-04-04 MED ORDER — LISINOPRIL-HYDROCHLOROTHIAZIDE 20-25 MG PO TABS: 1.0000 | ORAL_TABLET | Freq: Every day | ORAL | 4 refills | Status: DC

## 2024-04-04 NOTE — Telephone Encounter (Signed)
 Copied from CRM #[id number]. Topic: Clinical - Medication Question >> Apr 04, 2024  4:43 PM [place] J wrote: Reason for CRM: The patient's wife would like to know if Dr. Rilla would be willing to prescribe enough  lisinopril -hydrochlorothiazide  (ZESTORETIC ) 20-25 MG tablet until the patient's appointment on 01/06.

## 2024-04-14 ENCOUNTER — Ambulatory Visit: Admitting: Family Medicine

## 2024-04-25 ENCOUNTER — Ambulatory Visit: Admitting: Family Medicine

## 2024-04-25 ENCOUNTER — Encounter: Payer: Self-pay | Admitting: Family Medicine

## 2024-04-25 VITALS — BP 124/80 | HR 82 | Temp 98.2°F | Ht 67.5 in | Wt 185.0 lb

## 2024-04-25 DIAGNOSIS — I10 Essential (primary) hypertension: Secondary | ICD-10-CM | POA: Diagnosis not present

## 2024-04-25 DIAGNOSIS — R7303 Prediabetes: Secondary | ICD-10-CM

## 2024-04-25 DIAGNOSIS — Z8739 Personal history of other diseases of the musculoskeletal system and connective tissue: Secondary | ICD-10-CM | POA: Insufficient documentation

## 2024-04-25 DIAGNOSIS — E785 Hyperlipidemia, unspecified: Secondary | ICD-10-CM

## 2024-04-25 DIAGNOSIS — Z23 Encounter for immunization: Secondary | ICD-10-CM | POA: Diagnosis not present

## 2024-04-25 DIAGNOSIS — Z0000 Encounter for general adult medical examination without abnormal findings: Secondary | ICD-10-CM

## 2024-04-25 DIAGNOSIS — Z125 Encounter for screening for malignant neoplasm of prostate: Secondary | ICD-10-CM | POA: Diagnosis not present

## 2024-04-25 MED ORDER — LISINOPRIL-HYDROCHLOROTHIAZIDE 20-25 MG PO TABS: 1.0000 | ORAL_TABLET | Freq: Every day | ORAL | 3 refills | Status: AC

## 2024-04-25 NOTE — Patient Instructions (Addendum)
 Tdap today (tetanus and whooping cough).  Labs today  Let me know if interested in heart CT scan with calcium  score ($100) as a screening test for heart disease.  Good to see you today Return in 1 year for next physical

## 2024-04-25 NOTE — Assessment & Plan Note (Signed)
 Update A1c ?

## 2024-04-25 NOTE — Assessment & Plan Note (Signed)
 Chronic, stable. Continue current regimen.  Reviewed thiazide diuretic can increase gout risk - he opts to stay on zestoretic  given no h/o recurrent gout.

## 2024-04-25 NOTE — Assessment & Plan Note (Addendum)
 Chronic, off statin. Fmhx CAD/MI (father age 63yo). Offered heart CT with calcium  score for further risk stratification - he will consider  The 10-year ASCVD risk score (Arnett DK, et al., 2019) is: 9.6%   Values used to calculate the score:     Age: 7 years     Clinically relevant sex: Male     Is Non-Hispanic African American: No     Diabetic: No     Tobacco smoker: No     Systolic Blood Pressure: 124 mmHg     Is BP treated: Yes     HDL Cholesterol: 48.2 mg/dL     Total Cholesterol: 161 mg/dL d

## 2024-04-25 NOTE — Assessment & Plan Note (Addendum)
 H/o R podagra 12/2023 treated with prednisone  and colchicine .  Reviewed low purine diet.  Reviewed hydrochlorothiazide  can increase risk of gout.  Declines change today - if recurrent gout flare, he will return to discuss change in antihypertensive.

## 2024-04-25 NOTE — Progress Notes (Signed)
 " Ph: [phone] Fax: [phone]   Patient ID: Keith ONEIDA Estelle Mickey., male    DOB: [birth year], 64 y.o.   MRN: [id number]  This visit was conducted in person.  BP 124/80 (BP Location: Left Arm, Patient Position: Sitting, Cuff Size: Normal)   Pulse 82   Temp 98.2 F (36.8 C) (Oral)   Ht 5' 7.5 (1.715 m)   Wt 185 lb (83.9 kg)   SpO2 96%   BMI 28.55 kg/m    CC: CPE Subjective:   HPI: Keith Bibbee. is a 64 y.o. male presenting on 04/25/2024 for Medical Management of Chronic Issues (Needs BP refills/States his bp reads about 125/78 normally at home)   Episode of gout 12/2023 - treated with prednisone  followed by colchicine . He has not had recurrent gout flares. He is on hydrochlorothiazide .   Preventative: Colon cancer screening - Cologuard 01/2023 negative  Prostate cancer screening - no fmhx.  No nocturia, strong stream. Continue PSA.  Lung cancer screen - not eligible  Flu shot declines  COVID vaccine - declines  Prevnar-20 - discussed, declines Tetanus shot - 02/12/2012, today Shingrix - discussed, declines Seat belt use discussed  Sunscreen use discussed.  No changing moles.  Non smoker Alcohol - 3 beers/wk Dentist - recent dental work, yearly Eye exam - has not seen, no fmhx glaucoma Bowel - no constipation    [name] Caffeine: mountain dews 1/day, 2 cups tea  Lives with wife, 1 daughter (1998), 1 dog  Occupation: chartered certified accountant  Edu: community college  Activity: walking at work Diet: good water daily, some fruits/vegetables     Relevant past medical, surgical, family and social history reviewed and updated as indicated. Interim medical history since our last visit reviewed. Allergies and medications reviewed and updated. Outpatient Medications Prior to Visit  Medication Sig Dispense Refill   fluticasone  (FLONASE ) 50 MCG/ACT nasal spray SPRAY 2 SPRAYS INTO EACH NOSTRIL EVERY DAY (Patient taking differently: as needed. SPRAY 2 SPRAYS INTO EACH NOSTRIL  EVERY DAY) 48 mL 3   ibuprofen (ADVIL,MOTRIN) 200 MG tablet Take 200 mg by mouth every 6 (six) hours as needed.     Multiple Vitamins-Minerals (EMERGEN-C IMMUNE PLUS) PACK Take by mouth daily. (Patient taking differently: Take by mouth every other day.)     lisinopril -hydrochlorothiazide  (ZESTORETIC ) 20-25 MG tablet Take 1 tablet by mouth daily. 90 tablet 4   colchicine  0.6 MG tablet TAKE 1 TABLET (0.6 MG TOTAL) BY MOUTH DAILY AS NEEDED (GOUT FLARE). (Patient not taking: Reported on 04/25/2024) 30 tablet 3   predniSONE  (DELTASONE ) 20 MG tablet Take two tablets daily for 3 days followed by one tablet daily for 4 days (Patient not taking: Reported on 04/25/2024) 10 tablet 0   No facility-administered medications prior to visit.     Per HPI unless specifically indicated in ROS section below Review of Systems  Constitutional:  Negative for activity change, appetite change, chills, fatigue, fever and unexpected weight change.  HENT:  Negative for hearing loss.   Eyes:  Negative for visual disturbance.  Respiratory:  Negative for cough, chest tightness, shortness of breath and wheezing.   Cardiovascular:  Negative for chest pain, palpitations and leg swelling.  Gastrointestinal:  Negative for abdominal distention, abdominal pain, blood in stool, constipation, diarrhea, nausea and vomiting.  Genitourinary:  Negative for difficulty urinating and hematuria.  Musculoskeletal:  Negative for arthralgias, myalgias and neck pain.  Skin:  Negative for rash.  Neurological:  Negative for dizziness, seizures, syncope and headaches.  Hematological:  Negative for adenopathy. Does not bruise/bleed easily.  Psychiatric/Behavioral:  Negative for dysphoric mood. The patient is not nervous/anxious.     Objective:  BP 124/80 (BP Location: Left Arm, Patient Position: Sitting, Cuff Size: Normal)   Pulse 82   Temp 98.2 F (36.8 C) (Oral)   Ht 5' 7.5 (1.715 m)   Wt 185 lb (83.9 kg)   SpO2 96%   BMI 28.55 kg/m    Wt Readings from Last 3 Encounters:  04/25/24 185 lb (83.9 kg)  01/05/24 186 lb 8 oz (84.6 kg)  01/19/23 177 lb 6 oz (80.5 kg)      Physical Exam Vitals and nursing note reviewed.  Constitutional:      General: He is not in acute distress.    Appearance: Normal appearance. He is well-developed. He is not ill-appearing.  HENT:     Head: Normocephalic and atraumatic.     Right Ear: Hearing, tympanic membrane, ear canal and external ear normal.     Left Ear: Hearing, tympanic membrane, ear canal and external ear normal.     Mouth/Throat:     Mouth: Mucous membranes are moist.     Pharynx: Oropharynx is clear. No oropharyngeal exudate or posterior oropharyngeal erythema.  Eyes:     General: No scleral icterus.    Extraocular Movements: Extraocular movements intact.     Conjunctiva/sclera: Conjunctivae normal.     Pupils: Pupils are equal, round, and reactive to light.  Neck:     Thyroid: No thyroid mass or thyromegaly.  Cardiovascular:     Rate and Rhythm: Normal rate and regular rhythm.     Pulses: Normal pulses.          Radial pulses are 2+ on the right side and 2+ on the left side.     Heart sounds: Normal heart sounds. No murmur heard. Pulmonary:     Effort: Pulmonary effort is normal. No respiratory distress.     Breath sounds: Normal breath sounds. No wheezing, rhonchi or rales.  Abdominal:     General: Bowel sounds are normal. There is no distension.     Palpations: Abdomen is soft. There is no mass.     Tenderness: There is no abdominal tenderness. There is no guarding or rebound.     Hernia: No hernia is present.  Musculoskeletal:        General: Normal range of motion.     Cervical back: Normal range of motion and neck supple.     Right lower leg: No edema.     Left lower leg: No edema.  Lymphadenopathy:     Cervical: No cervical adenopathy.  Skin:    General: Skin is warm and dry.     Findings: No rash.  Neurological:     General: No focal deficit present.      Mental Status: He is alert and oriented to person, place, and time.  Psychiatric:        Mood and Affect: Mood normal.        Behavior: Behavior normal.        Thought Content: Thought content normal.        Judgment: Judgment normal.       Results for orders placed or performed in visit on 01/05/24  CBC   Collection Time: 01/05/24  4:11 PM  Result Value Ref Range   WBC 11.4 (H) 4.0 - 10.5 K/uL   RBC 4.34 4.22 - 5.81 Mil/uL   Platelets 237.0 150.0 - 400.0 K/uL  Hemoglobin 13.5 13.0 - 17.0 g/dL   HCT 60.1 60.9 - 47.9 %   MCV 91.7 78.0 - 100.0 fl   MCHC 33.9 30.0 - 36.0 g/dL   RDW 86.7 88.4 - 84.4 %  Uric acid   Collection Time: 01/05/24  4:11 PM  Result Value Ref Range   Uric Acid, Serum 6.8 4.0 - 7.8 mg/dL    Assessment & Plan:   Problem List Items Addressed This Visit     Healthcare maintenance - Primary (Chronic)   Preventative protocols reviewed and updated unless pt declined. Discussed healthy diet and lifestyle.       HTN (hypertension)   Chronic, stable. Continue current regimen.  Reviewed thiazide diuretic can increase gout risk - he opts to stay on zestoretic  given no h/o recurrent gout.       Relevant Medications   lisinopril -hydrochlorothiazide  (ZESTORETIC ) 20-25 MG tablet   Prediabetes   Update A1c.       Relevant Orders   Hemoglobin A1c   HLD (hyperlipidemia)   Chronic, off statin. Fmhx CAD/MI (father age 10yo). Offered heart CT with calcium  score for further risk stratification - he will consider  The 10-year ASCVD risk score (Arnett DK, et al., 2019) is: 9.6%   Values used to calculate the score:     Age: 63 years     Clinically relevant sex: Male     Is Non-Hispanic African American: No     Diabetic: No     Tobacco smoker: No     Systolic Blood Pressure: 124 mmHg     Is BP treated: Yes     HDL Cholesterol: 48.2 mg/dL     Total Cholesterol: 161 mg/dL d      Relevant Medications   lisinopril -hydrochlorothiazide  (ZESTORETIC ) 20-25  MG tablet   Other Relevant Orders   Lipid panel   Comprehensive metabolic panel with GFR   History of gout   H/o R podagra 12/2023 treated with prednisone  and colchicine .  Reviewed low purine diet.  Reviewed hydrochlorothiazide  can increase risk of gout.  Declines change today - if recurrent gout flare, he will return to discuss change in antihypertensive.       Relevant Orders   Uric Acid   Other Visit Diagnoses       Special screening for malignant neoplasm of prostate       Relevant Orders   PSA     Need for diphtheria-tetanus-pertussis (Tdap) vaccine            Meds ordered this encounter  Medications   lisinopril -hydrochlorothiazide  (ZESTORETIC ) 20-25 MG tablet    Sig: Take 1 tablet by mouth daily.    Dispense:  90 tablet    Refill:  3    Orders Placed This Encounter  Procedures   Tdap vaccine greater than or equal to 7yo IM   Lipid panel   Comprehensive metabolic panel with GFR   Hemoglobin A1c   PSA   Uric Acid    Patient Instructions  Tdap today (tetanus and whooping cough).  Labs today  Let me know if interested in heart CT scan with calcium  score ($100) as a screening test for heart disease.  Good to see you today Return in 1 year for next physical   Follow up plan: Return in about 1 year (around 04/25/2025) for annual exam, prior fasting for blood work.  Anton Blas, MD   "

## 2024-04-25 NOTE — Assessment & Plan Note (Signed)
 Preventative protocols reviewed and updated unless pt declined. Discussed healthy diet and lifestyle.

## 2024-04-26 LAB — COMPREHENSIVE METABOLIC PANEL WITH GFR
ALT: 22 U/L (ref 3–53)
AST: 25 U/L (ref 5–37)
Albumin: 4.5 g/dL (ref 3.5–5.2)
Alkaline Phosphatase: 55 U/L (ref 39–117)
BUN: 15 mg/dL (ref 6–23)
CO2: 31 meq/L (ref 19–32)
Calcium: 9.3 mg/dL (ref 8.4–10.5)
Chloride: 96 meq/L (ref 96–112)
Creatinine, Ser: 0.97 mg/dL (ref 0.40–1.50)
GFR: 83.76 mL/min
Glucose, Bld: 109 mg/dL — ABNORMAL HIGH (ref 70–99)
Potassium: 3.7 meq/L (ref 3.5–5.1)
Sodium: 134 meq/L — ABNORMAL LOW (ref 135–145)
Total Bilirubin: 0.5 mg/dL (ref 0.2–1.2)
Total Protein: 6.8 g/dL (ref 6.0–8.3)

## 2024-04-26 LAB — LIPID PANEL
Cholesterol: 189 mg/dL (ref 28–200)
HDL: 50 mg/dL
LDL Cholesterol: 65 mg/dL (ref 10–99)
NonHDL: 139.27
Total CHOL/HDL Ratio: 4
Triglycerides: 371 mg/dL — ABNORMAL HIGH (ref 10.0–149.0)
VLDL: 74.2 mg/dL — ABNORMAL HIGH (ref 0.0–40.0)

## 2024-04-26 LAB — PSA: PSA: 0.7 ng/mL (ref 0.10–4.00)

## 2024-04-26 LAB — HEMOGLOBIN A1C: Hgb A1c MFr Bld: 5.9 % (ref 4.6–6.5)

## 2024-04-26 LAB — URIC ACID: Uric Acid, Serum: 7.7 mg/dL (ref 4.0–7.8)

## 2024-05-01 ENCOUNTER — Ambulatory Visit: Payer: Self-pay | Admitting: Family Medicine

## 2024-05-01 DIAGNOSIS — E785 Hyperlipidemia, unspecified: Secondary | ICD-10-CM
